# Patient Record
Sex: Female | Born: 1995 | Race: Black or African American | Hispanic: No | Marital: Single | Smoking: Never smoker
Health system: Southern US, Community
[De-identification: ages and names within clinical notes are randomized; demographics above are authoritative.]

## PROBLEM LIST (undated history)

## (undated) DIAGNOSIS — D649 Anemia, unspecified: Secondary | ICD-10-CM

## (undated) DIAGNOSIS — G629 Polyneuropathy, unspecified: Secondary | ICD-10-CM

## (undated) DIAGNOSIS — G319 Degenerative disease of nervous system, unspecified: Secondary | ICD-10-CM

## (undated) DIAGNOSIS — G610 Guillain-Barre syndrome: Secondary | ICD-10-CM

## (undated) HISTORY — PX: NO PAST SURGERIES: SHX2092

---
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
## 2014-04-11 ENCOUNTER — Emergency Department (HOSPITAL_COMMUNITY)
Admission: EM | Admit: 2014-04-11 | Discharge: 2014-04-11 | Disposition: A | Discharge status: Home / Self Care | Payer: Managed Care, Other (non HMO) | Attending: Emergency Medicine | Admitting: Emergency Medicine

## 2014-04-11 ENCOUNTER — Encounter (HOSPITAL_COMMUNITY): Payer: Self-pay | Admitting: Emergency Medicine

## 2014-04-11 DIAGNOSIS — Z862 Personal history of diseases of the blood and blood-forming organs and certain disorders involving the immune mechanism: Secondary | ICD-10-CM | POA: Diagnosis not present

## 2014-04-11 DIAGNOSIS — R209 Unspecified disturbances of skin sensation: Secondary | ICD-10-CM | POA: Insufficient documentation

## 2014-04-11 DIAGNOSIS — R269 Unspecified abnormalities of gait and mobility: Secondary | ICD-10-CM | POA: Insufficient documentation

## 2014-04-11 DIAGNOSIS — R5381 Other malaise: Secondary | ICD-10-CM | POA: Insufficient documentation

## 2014-04-11 DIAGNOSIS — G610 Guillain-Barre syndrome: Secondary | ICD-10-CM | POA: Insufficient documentation

## 2014-04-11 DIAGNOSIS — R5383 Other fatigue: Secondary | ICD-10-CM

## 2014-04-11 HISTORY — DX: Anemia, unspecified: D64.9

## 2014-04-11 NOTE — ED Notes (Addendum)
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 "Now legs just feel weak". (denies: pain fall or injury). "Tingling started with feet and went up to mid thigh, both sides equally". H/o distant GBS at age 89. PCP and neuro & ortho specialists in [place]. 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 PERRL. MAEx4 equally and strong. Reports "can walk, just difficult to walk". "felt like I would fall". Alert, NAD, calm, interactive, speech clear, no dyspnea, LS CTA. Here with other adults x2.

## 2014-04-11 NOTE — Discharge Instructions (Signed)
Guillain-Barre Syndrome °Guillain-Barré syndrome is a disorder in which the body's protection (immune) system attacks part of the nervous system. This syndrome is rare. Guillain-Barré is called a syndrome rather than a disease because it is not clear that a specific disease-causing agent is involved. °CAUSES  °· Usually Guillain-Barré occurs a few days or weeks after the patient has had symptoms of a viral infection: °¨ Breathing (respiratory). °¨ Stomach (gastrointestinal). °· Sometimes, surgery or vaccinations will set off the syndrome. °· The disorder can develop over the course of hours or days. Or it may take up to 3 to 4 weeks. °· No one yet knows why Guillain-Barré strikes some people and not others. Nor do they know what sets the disease in motion. °· What scientists do know is that the body's immune system begins to attack the body itself. This causes what is known as an autoimmune disease. °SYMPTOMS  °The first problems (symptoms) of this disorder include varying degrees of weakness or tingling sensations (feeling) in the legs. In many cases, the weakness and abnormal sensations spread to the arms and upper body. These symptoms may worsen until the muscles cannot be used at all. Then the patient is almost totally paralyzed. In these cases, the disorder is life-threatening. It is considered a medical emergency. The patient is often put on a respirator to assist with breathing. Most patients recover from even the most severe cases of this syndrome. But some continue to have some degree of weakness. °DIAGNOSIS  °Guillain-Barré is called a syndrome rather than a disease because it is not clear that a specific disease-causing agent is involved. Reflexes such as knee jerks are usually lost. Signals traveling along the nerve are slower. So a nerve conduction velocity (NCV) test can give caregivers clues to aid the diagnosis. This means they try to learn what is wrong. The cerebrospinal fluid (CSF) that bathes the  spinal cord and brain contains more protein than usual. So a caregiver may decide to perform a spinal tap. °TREATMENT  °There is no known cure for this syndrome. But treatments can give some relief and speed up the recovery in most patients. There are also a number of ways to treat the complications of the syndrome. Currently, plasmapheresis and high-dose immunoglobulin therapy are used. Plasmapheresis seems to reduce the severity and length of the Guillain-Barré episode. In high-dose immunoglobulin therapy, caregivers give intravenous injections of the proteins that, in small quantities, the immune system uses naturally to attack invading organisms. Researchers have found that giving high doses of these immunoglobulins to patients can lessen the immune system attack on the nervous system. The most important part of the treatment for this syndrome is keeping the patient's body functioning during recovery of the nervous system. This can sometimes require placing the patient on: °· A respirator. °· A heart monitor. °· Other machines that assist body function. °This syndrome can be devastating due to its sudden and unexpected beginning. Most people reach the stage of greatest weakness within the first 2 weeks after symptoms appear. By the third week of the illness, 90 percent of all patients are at their weakest. The recovery period may be as little as a few weeks. Or it can be as long as a few years. About 30 percent of patients still have a remaining weakness after 3 years. About 3 percent may suffer a return of muscle weakness and tingling sensations many years after the initial attack. °RESEARCH BEING DONE °Scientists are concentrating on finding new treatments and refining   existing ones. They are also looking at the workings of the immune system. They want to find which cells are responsible for beginning and carrying out the attack on the nervous system. The fact that so many cases of this syndrome begin after a  viral or germ (bacterial ) infection suggests that certain characteristics of some viruses and bacteria may activate the immune system improperly. Investigators are searching for those characteristics. Neurological scientists, immunologists, virologists, and pharmacologists are all working together: °· To learn how to prevent this disorder. °· To make better therapies available when it strikes. °FOR MORE INFORMATION °Guillain-Barre Syndrome Foundation International °www.gbs-cipd.org °Document Released: 08/19/2002 Document Revised: 11/21/2011 Document Reviewed: 10/30/2013 °ExitCare® Patient Information ©2015 [employer]. This information is not intended to replace advice given to you by your health care provider. Make sure you discuss any questions you have with your health care provider. ° °

## 2014-04-11 NOTE — ED Notes (Signed)
Pt's respirations are equal and non labored. 

## 2014-04-11 NOTE — ED Provider Notes (Signed)
CSN: [id number]     Arrival date & time 04/11/14  2040 History   First MD Initiated Contact with Patient 04/11/14 2056     Chief Complaint  Patient presents with  . Extremity Weakness    HPI Comments: Patient with a history of GBS presents with 2 episodes of tingling and gradual weakness in her legs bilaterally. First episode was at 11:45 AM when she was cooking. Came from out of no where and began in her feet. Couldn't stand and began to wobble. Lasted 45 minutes and then returned to baseline. Next episode was at 7 PM while she was at work at [employer], putting away dishes at [employer] place. Was same episode but was worse. Tingling goes to top of things and loss all feeling. Has since regained feeling but is still weak and can't fully walk. Has not had an episode like this. Last similar episode was 2.5 years ago when she was numb on her right arm. Came into the hospital and given some medicine. Take Neurontin when she has nerve pain, last took medication in January. Patient denies pain, fevers, being sick recently, rashes.  Patient is a 18 y.o. female presenting with extremity weakness. The history is provided by the patient. No language interpreter was used.  Extremity Weakness This is a new problem. The current episode started today. The problem has been resolved. Associated symptoms include numbness and weakness. Pertinent negatives include no abdominal pain, coughing, fatigue, fever, joint swelling, nausea, rash, sore throat or vomiting. The symptoms are aggravated by standing and walking. She has tried nothing for the symptoms.    Past Medical History  Diagnosis Date  . Guillain-Barre age 14  . Anemia    Past Surgical History  Procedure Laterality Date  . No past surgeries     No family history on file. History  Substance Use Topics  . Smoking status: Never Smoker   . Smokeless tobacco: Never Used  . Alcohol Use: Yes   OB History   Grav Para Term Preterm Abortions TAB SAB Ect Mult  Living                 Review of Systems  Constitutional: Negative for fever and fatigue.  HENT: Negative for sore throat.   Respiratory: Negative for cough.   Gastrointestinal: Negative for nausea, vomiting and abdominal pain.  Musculoskeletal: Positive for extremity weakness. Negative for joint swelling.  Skin: Negative for rash.  Neurological: Positive for weakness and numbness.  All other systems reviewed and are negative.   Allergies  Other - none  Home Medications   Prior to Admission medications   Medication Sig Start Date End Date Taking? Authorizing Provider  ibuprofen (ADVIL,MOTRIN) 200 MG tablet Take 200 mg by mouth every 6 (six) hours as needed for pain.    Historical Provider, MD  Iron - daily   Patient lives in [place]  BP 145/79  Pulse 84  Temp(Src) 98.3 F (36.8 C) (Oral)  Resp 16  Wt 105 lb 11.2 oz (47.945 kg)  SpO2 99% Physical Exam  Nursing note and vitals reviewed. Constitutional: She appears well-developed and well-nourished. No distress.  HENT:  Head: Normocephalic and atraumatic.  Nose: Nose normal.  Eyes: Conjunctivae and EOM are normal. Right eye exhibits no discharge. Left eye exhibits no discharge. No scleral icterus.  Neck: Normal range of motion. Neck supple.  Cardiovascular: Normal rate, regular rhythm and intact distal pulses.   No murmur heard. Pulmonary/Chest: Effort normal and breath sounds normal. No respiratory  distress.  No increase in WOB  Abdominal: Soft. Bowel sounds are normal. She exhibits no mass. There is no tenderness.  Musculoskeletal:  Strength 5/5 in lower extremities bilaterally. No weakness noted.  Neurological: She is alert. She has normal strength. She displays no atrophy, no tremor and normal reflexes. She exhibits normal muscle tone. She displays no seizure activity. Gait abnormal.  No patellar reflexes present bilaterally. Sensation is not intact on lateral aspects of dorsum of feet bilaterally. Intact on midline  and posterior region of feet. Sensation intact to deep palpation of toes bilaterally. Patient wobbles when she walks on both legs.  Skin: Skin is warm. No rash noted. No erythema.  Psychiatric: She has a normal mood and affect. Her behavior is normal.    ED Course  Procedures (including critical care time) Labs Review Labs Reviewed - No data to display  Imaging Review No results found.   EKG Interpretation None     Patient seen and examined. Spoke with colleague of Dr. Blain Paisartwright and stated that patient was last seen 1 year ago for FU. 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 Was supposed to have FU imaging in 1 year but did not. Stated that it was unusual to have a relapse of GBS this late. Would not do IVIG at this time with no respiratory abnormalities, overt weakness and studies. Would recommend patient to see how she does over the weekend. If she can't stand or walk or has respiratory impairment, should come [facility] to get admitted. If she improves over the weekend, should come to the clinic on Monday for an appointment. Could possibly have an EMG study at this time to see if any further progression of GBS.   MDM   Final diagnoses:  GBS (Guillain Barre syndrome)  See above for [facility] with [facility] Patient will monitor symptoms this weekend and decide a course of action In the meantime if have respiratory depression or can't walk or stand, will take immediate action     Preston FleetingAkilah O Donyale Berthold, MD 04/11/14 2250

## 2014-04-12 NOTE — ED Provider Notes (Signed)
I saw and evaluated the patient, reviewed the resident's note and I agree with the findings and plan. All other systems reviewed as per HPI, otherwise negative.   Pt with hx fo Karlene LinemanGuillian Barre as 18 year old with residual weakness, presents with acute onset of tingling and weakness x 2.  Returning to baseline at this time.  No loss of bowel or bladder.  On exam, pt at baseline.  D[facility] with [facility], and patient to follow up as outpatients since the symptoms have nearly resolved.  Family and patient aware of plan.  Chrystine Oileross J Messiyah Waterson, MD 04/12/14 [phone]

## 2016-09-04 ENCOUNTER — Emergency Department (HOSPITAL_COMMUNITY)
Admission: EM | Admit: 2016-09-04 | Discharge: 2016-09-04 | Disposition: A | Discharge status: Home / Self Care | Payer: Medicaid Other | Attending: Dermatology | Admitting: Dermatology

## 2016-09-04 ENCOUNTER — Encounter (HOSPITAL_COMMUNITY): Payer: Self-pay | Admitting: Oncology

## 2016-09-04 DIAGNOSIS — R200 Anesthesia of skin: Secondary | ICD-10-CM | POA: Insufficient documentation

## 2016-09-04 DIAGNOSIS — Z5321 Procedure and treatment not carried out due to patient leaving prior to being seen by health care provider: Secondary | ICD-10-CM | POA: Diagnosis not present

## 2016-09-04 NOTE — ED Notes (Signed)
Pt called w/o answer x 2.  Lobby is empty.  It is assumed the Pt left.
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
## 2016-09-04 NOTE — ED Notes (Signed)
Pt called w/o answer x 1.

## 2016-12-16 LAB — HM PAP SMEAR

## 2017-04-18 ENCOUNTER — Encounter (HOSPITAL_COMMUNITY): Payer: Self-pay | Admitting: *Deleted

## 2017-04-18 ENCOUNTER — Ambulatory Visit (HOSPITAL_COMMUNITY)
Admission: EM | Admit: 2017-04-18 | Discharge: 2017-04-18 | Disposition: A | Discharge status: Home / Self Care | Payer: 59 | Attending: Family Medicine | Admitting: Family Medicine

## 2017-04-18 DIAGNOSIS — R350 Frequency of micturition: Secondary | ICD-10-CM | POA: Diagnosis not present

## 2017-04-18 DIAGNOSIS — R300 Dysuria: Secondary | ICD-10-CM | POA: Diagnosis not present

## 2017-04-18 DIAGNOSIS — N3001 Acute cystitis with hematuria: Secondary | ICD-10-CM | POA: Insufficient documentation

## 2017-04-18 DIAGNOSIS — Z3202 Encounter for pregnancy test, result negative: Secondary | ICD-10-CM

## 2017-04-18 DIAGNOSIS — R0789 Other chest pain: Secondary | ICD-10-CM | POA: Insufficient documentation

## 2017-04-18 DIAGNOSIS — R079 Chest pain, unspecified: Secondary | ICD-10-CM | POA: Diagnosis present

## 2017-04-18 LAB — POCT URINALYSIS DIP (DEVICE)
Bilirubin Urine: NEGATIVE
Glucose, UA: NEGATIVE mg/dL
Ketones, ur: NEGATIVE mg/dL
NITRITE: NEGATIVE
PH: 6.5 (ref 5.0–8.0)
PROTEIN: NEGATIVE mg/dL
UROBILINOGEN UA: 0.2 mg/dL (ref 0.0–1.0)

## 2017-04-18 LAB — POCT PREGNANCY, URINE: Preg Test, Ur: NEGATIVE

## 2017-04-18 MED ORDER — SULFAMETHOXAZOLE-TRIMETHOPRIM 800-160 MG PO TABS: 1.0000 | ORAL_TABLET | Freq: Two times a day (BID) | ORAL | 0 refills | Status: AC

## 2017-04-18 NOTE — ED Provider Notes (Signed)
  [facility]    ASSESSMENT & PLAN:  Today you were diagnosed with the following: 1. Acute cystitis with hematuria   2. Chest pain, unspecified type    Meds ordered this encounter  Medications  . sulfamethoxazole-trimethoprim (BACTRIM DS,SEPTRA DS) 800-160 MG tablet    Sig: Take 1 tablet by mouth 2 (two) times daily.    Dispense:  10 tablet    Refill:  0   Urine culture sent. ECG: NSR; no worrisome abnormalities  Unsure of the exact cause of her chest symptoms but I reassured her that I do not feel this is anything dangerous. Will monitor. Copy of her ECG given.  If not improving over the next few days or if worsening she will follow up here or the Emergency Department if unable to see her regular doctor.  Outlined signs and symptoms indicating need for more acute intervention. Call or return to clinic if these symptoms worsen or fail to improve as anticipated. Patient verbalized understanding. After Visit Summary given.  SUBJECTIVE:  Taylor Pierce is a 21 y.o. female who complains of urinary frequency, urgency and dysuria for the past 2-3 days, without flank pain, fever, chills, abnormal vaginal discharge or bleeding. Does report hematuria today. Normal PO intake. No flank or abdominal pain. No self treatment. Ambulatory without problem.  Also for the past 2-3 days she has been feeling occasional anterior chest pains. Usually last several minutes then resolve. Describes as "tightness". No associated SOB or respiratory symptoms. Non-smoker. No specific aggravating or alleviating factors reported.  ROS: As per HPI. All other systems negative.  BP 120/74 (BP Location: Right Arm)   Pulse 86   Temp 98.2 F (36.8 C) (Oral)   Resp 16   OBJECTIVE:  Vitals:   04/18/17 1448  BP: 120/74  Pulse: 86  Resp: 16  Temp: 98.2 F (36.8 C)  TempSrc: Oral  SpO2: 99%   Appears well, in no apparent distress. Abdomen is soft without tenderness, guarding, mass, rebound or  organomegaly. No CVA tenderness or inguinal adenopathy noted. No chest wall discomfort. CV: sinus arrhythmia; regular rate. Lungs CTAB. Skin is warm and dry. She is alert and does not appear anxious.  Labs Reviewed  POCT URINALYSIS DIP (DEVICE) - Abnormal; Notable for the following:       Result Value   Hgb urine dipstick LARGE (*)    Leukocytes, UA MODERATE (*)    All other components within normal limits  URINE CULTURE  POCT PREGNANCY, URINE    Allergies  Allergen Reactions  . Other Other (See Comments)    IV pain medication, cannot remember name of drug        Mardella LaymanHagler, Stassi Fadely, MD 04/19/17 [phone]

## 2017-04-18 NOTE — ED Triage Notes (Signed)
Pt  Reports    Several  Days   Of  Low  abd  Pain   With   Hematuria  And  Notice   Some  Blood   When  She  Urinates      Pt  Ambulates   With a  Slow  Steady  Gait  And  Appears  In no  Acute   Severe   Distress

## 2017-04-20 LAB — URINE CULTURE

## 2017-05-27 ENCOUNTER — Encounter (HOSPITAL_COMMUNITY): Payer: Self-pay | Admitting: Emergency Medicine

## 2017-05-27 ENCOUNTER — Ambulatory Visit (HOSPITAL_COMMUNITY)
Admission: EM | Admit: 2017-05-27 | Discharge: 2017-05-27 | Disposition: A | Discharge status: Home / Self Care | Payer: 59 | Attending: Family Medicine | Admitting: Family Medicine

## 2017-05-27 DIAGNOSIS — G629 Polyneuropathy, unspecified: Secondary | ICD-10-CM

## 2017-05-27 MED ORDER — GABAPENTIN 300 MG PO CAPS: 300.0000 mg | ORAL_CAPSULE | Freq: Two times a day (BID) | ORAL | 0 refills | Status: DC

## 2017-05-27 NOTE — ED Provider Notes (Signed)
[facility]    CSN: [id number] Arrival date & time: 05/27/17  1310     History   Chief Complaint Chief Complaint  Patient presents with  . nerve pain    HPI Taylor Pierce is a 21 y.o. female.   Pt here for nerve pain in the right side of her head, right leg and associated numbness.  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  She originally developed the neurological syndrome at age 89.  She has flare-ups of pain periodically  She states she had taken Gabapentin but has not been taking it because she was pregnant and breast feeding.  She has an appointment with her neurologist in [place], Dr. Blain Pais  She works at [employer].  She has 83 month old daughter with her today.      Past Medical History:  Diagnosis Date  . Anemia   . Guillain-Barre [facility]) age 89    There are no active problems to display for this patient.   Past Surgical History:  Procedure Laterality Date  . NO PAST SURGERIES      OB History    Gravida Para Term Preterm AB Living   1             SAB TAB Ectopic Multiple Live Births                   Home Medications    Prior to Admission medications   Medication Sig Start Date End Date Taking? Authorizing Provider  gabapentin (NEURONTIN) 300 MG capsule Take 1 capsule (300 mg total) by mouth 2 (two) times daily. 05/27/17   Elvina Sidle, MD  ibuprofen (ADVIL,MOTRIN) 200 MG tablet Take 200 mg by mouth every 6 (six) hours as needed for pain.    [provider]    Family History History reviewed. No pertinent family history.  Social History Social History  Substance Use Topics  . Smoking status: Never Smoker  . Smokeless tobacco: Never Used  . Alcohol use Yes     Allergies   Other   Review of Systems Review of Systems  Constitutional: Negative.   HENT: Negative.   Eyes: Negative.   Respiratory: Negative.   Cardiovascular: Negative.   Gastrointestinal: Negative.       Physical Exam Triage Vital Signs ED Triage Vitals  Enc Vitals Group     BP 05/27/17 1412 119/73     Pulse Rate 05/27/17 1412 79     Resp --      Temp 05/27/17 1412 98.4 F (36.9 C)     Temp Source 05/27/17 1412 Oral     SpO2 05/27/17 1412 99 %     Weight --      Height --      Head Circumference --      Peak Flow --      Pain Score 05/27/17 1415 7     Pain Loc --      Pain Edu? --      Excl. in GC? --    No data found.   Updated Vital Signs BP 119/73 (BP Location: Left Arm)   Pulse 79   Temp 98.4 F (36.9 C) (Oral)   SpO2 99%   Breastfeeding? No    Physical Exam  Constitutional: She is oriented to person, place, and time.  HENT:  Right Ear: External ear normal.  Left Ear: External ear normal.  Mouth/Throat: Oropharynx is clear and moist.  Eyes: Pupils are equal, round, and reactive to light. Conjunctivae are normal.  Neck: Normal range of motion. Neck supple.  Pulmonary/Chest: Effort normal.  Musculoskeletal: Normal range of motion.  Neurological: She is alert and oriented to person, place, and time. No cranial nerve deficit. Coordination normal.  Skin: Skin is warm and dry.  Nursing note and vitals reviewed.    UC Treatments / Results  Labs (all labs ordered are listed, but only abnormal results are displayed) Labs Reviewed - No data to display  EKG  EKG Interpretation None       Radiology No results found.  Procedures Procedures (including critical care time)  Medications Ordered in UC Medications - No data to display   Initial Impression / Assessment and Plan / UC Course  I have reviewed the triage vital signs and the nursing notes.  Pertinent labs & imaging results that were available during my care of the patient were reviewed by me and considered in my medical decision making (see chart for details).     Final Clinical Impressions(s) / UC Diagnoses   Final diagnoses:  Neuropathy    New Prescriptions New Prescriptions    GABAPENTIN (NEURONTIN) 300 MG CAPSULE    Take 1 capsule (300 mg total) by mouth 2 (two) times daily.     Controlled Substance Prescriptions [place] Controlled Substance Registry consulted? Not Applicable   Elvina Sidle, MD 05/27/17 1455

## 2017-05-27 NOTE — Discharge Instructions (Signed)
Follow up with Dr. Blain Pais as planned  If not getting pain relief in one week, increase gabapentin to three times a day.

## 2017-05-27 NOTE — ED Triage Notes (Signed)
Pt here for nerve pain in the right side of her head, right leg and associated numbness.  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  She states she takes Gabapentin but has not been taking it because she was pregnant and breast feeding.

## 2018-01-14 ENCOUNTER — Emergency Department (HOSPITAL_COMMUNITY)
Admission: EM | Admit: 2018-01-14 | Discharge: 2018-01-14 | Disposition: A | Discharge status: Home / Self Care | Payer: 59 | Attending: Emergency Medicine | Admitting: Emergency Medicine

## 2018-01-14 ENCOUNTER — Emergency Department (HOSPITAL_COMMUNITY): Payer: 59

## 2018-01-14 ENCOUNTER — Other Ambulatory Visit: Payer: Self-pay | Admitting: Emergency Medicine

## 2018-01-14 ENCOUNTER — Other Ambulatory Visit: Payer: Self-pay

## 2018-01-14 ENCOUNTER — Encounter (HOSPITAL_COMMUNITY): Payer: Self-pay | Admitting: Emergency Medicine

## 2018-01-14 DIAGNOSIS — N939 Abnormal uterine and vaginal bleeding, unspecified: Secondary | ICD-10-CM | POA: Diagnosis not present

## 2018-01-14 LAB — CBC
HCT: 35.4 % — ABNORMAL LOW (ref 36.0–46.0)
HEMOGLOBIN: 11.2 g/dL — AB (ref 12.0–15.0)
MCH: 26.5 pg (ref 26.0–34.0)
MCHC: 31.6 g/dL (ref 30.0–36.0)
MCV: 83.7 fL (ref 78.0–100.0)
Platelets: 356 10*3/uL (ref 150–400)
RBC: 4.23 MIL/uL (ref 3.87–5.11)
RDW: 15.4 % (ref 11.5–15.5)
WBC: 5.8 10*3/uL (ref 4.0–10.5)

## 2018-01-14 LAB — WET PREP, GENITAL
SPERM: NONE SEEN
Trich, Wet Prep: NONE SEEN
WBC WET PREP: NONE SEEN
Yeast Wet Prep HPF POC: NONE SEEN

## 2018-01-14 LAB — BASIC METABOLIC PANEL
Anion gap: 8 (ref 5–15)
BUN: 11 mg/dL (ref 6–20)
CHLORIDE: 102 mmol/L (ref 101–111)
CO2: 26 mmol/L (ref 22–32)
CREATININE: 0.63 mg/dL (ref 0.44–1.00)
Calcium: 9.4 mg/dL (ref 8.9–10.3)
GFR calc non Af Amer: >60 mL/min (ref >60)
Glucose, Bld: 91 mg/dL (ref 65–99)
POTASSIUM: 4.2 mmol/L (ref 3.5–5.1)
SODIUM: 136 mmol/L (ref 135–145)

## 2018-01-14 LAB — I-STAT BETA HCG BLOOD, ED (MC, WL, AP ONLY): I-stat hCG, quantitative: <5 m[IU]/mL (ref <5)

## 2018-01-14 NOTE — ED Notes (Signed)
Results reviewed, no changes to acuity at this time 

## 2018-01-14 NOTE — ED Triage Notes (Signed)
Pt. Stated, Taylor Pierce had vaginal bleeding for 2 weeks and I had an abortion in March, and I called my Gynecologist and he said to come here since I had an abortion earlier.

## 2018-01-14 NOTE — ED Provider Notes (Signed)
[facility] EMERGENCY DEPARTMENT Provider Note   CSN: [id number] Arrival date & time: 01/14/18  1230     History   Chief Complaint Chief Complaint  Patient presents with  . Vaginal Bleeding    HPI Taylor Pierce is a 22 y.o. female who presents emergency department for chief complaint of vaginal bleeding.  She has a past medical history of baseline anemia  And Guillan Barre. She is status post spontaneous vaginal delivery 14 months ago.  Patient had a D&C 2 months ago and is not currently on any birth control.  She states that after her D&C she had intermittent bleeding however 2 weeks ago she began having heavy vaginal bleeding and has been soaking through about 7 overnight pads daily.  She has been feeling lightheaded, fatigued and weak but denies shortness of breath.  The denies abdominal pain or cramping.  She has not had a regular period since giving birth.  She is not currently breast-feeding.  She spoke with her GYN this morning who recommended she come to the ER for evaluation and ultrasound.  Unable to see their phone conversation in the EMR.  HPI  Past Medical History:  Diagnosis Date  . Anemia   . Guillain-Barre [facility]) age 73    There are no active problems to display for this patient.   Past Surgical History:  Procedure Laterality Date  . NO PAST SURGERIES       OB History    Gravida  1   Para      Term      Preterm      AB      Living        SAB      TAB      Ectopic      Multiple      Live Births               Home Medications    Prior to Admission medications   Medication Sig Start Date End Date Taking? Authorizing Provider  gabapentin (NEURONTIN) 300 MG capsule Take 1 capsule (300 mg total) by mouth 2 (two) times daily. 05/27/17   Elvina Sidle, MD  ibuprofen (ADVIL,MOTRIN) 200 MG tablet Take 200 mg by mouth every 6 (six) hours as needed for pain.    [provider]    Family History No family history  on file.  Social History Social History   Tobacco Use  . Smoking status: Never Smoker  . Smokeless tobacco: Never Used  Substance Use Topics  . Alcohol use: Yes  . Drug use: No     Allergies   Other   Review of Systems Review of Systems  Ten systems reviewed and are negative for acute change, except as noted in the HPI.   Physical Exam Updated Vital Signs BP 113/75 (BP Location: Right Arm)   Pulse 90   Temp 98.7 F (37.1 C) (Oral)   Resp 18   Ht  (1.6 m)   Wt 49 kg (108 lb)   LMP 12/31/2017   SpO2 100%   BMI 19.13 kg/m   Physical Exam  Constitutional: She is oriented to person, place, and time. She appears well-developed and well-nourished. No distress.  HENT:  Head: Normocephalic and atraumatic.  Eyes: Conjunctivae are normal. No scleral icterus.  Neck: Normal range of motion.  Cardiovascular: Normal rate, regular rhythm and normal heart sounds. Exam reveals no gallop and no friction rub.  No murmur heard. Pulmonary/Chest: Effort  normal and breath sounds normal. No respiratory distress.  Abdominal: Soft. Bowel sounds are normal. She exhibits no distension and no mass. There is no tenderness. There is no guarding.  Genitourinary:  Genitourinary Comments: Pelvic exam: VULVA: normal appearing vulva with no masses, tenderness or lesions, VAGINA: normal appearing vagina with normal color and discharge, no lesions, CERVIX: cervical motion tenderness absent, multiparous os, Bleeding from the Cervical OS, ADNEXA: normal adnexa in size, nontender and no masses, exam chaperoned by NT Ezzard Flax.   Neurological: She is alert and oriented to person, place, and time.  Skin: Skin is warm and dry. She is not diaphoretic.  Psychiatric: Her behavior is normal.  Nursing note and vitals reviewed.    ED Treatments / Results  Labs (all labs ordered are listed, but only abnormal results are displayed) Labs Reviewed  WET PREP, GENITAL  RPR  HIV ANTIBODY (ROUTINE TESTING)   URINALYSIS, ROUTINE W REFLEX MICROSCOPIC  CBC  BASIC METABOLIC PANEL  I-STAT BETA HCG BLOOD, ED ([facility], [facility], [facility] ONLY)  GC/CHLAMYDIA PROBE AMP ([place]) NOT AT [facility]    EKG None  Radiology No results found.  Procedures Procedures (including critical care time)  Medications Ordered in ED Medications - No data to display   Initial Impression / Assessment and Plan / ED Course  I have reviewed the triage vital signs and the nursing notes.  Pertinent labs & imaging results that were available during my care of the patient were reviewed by me and considered in my medical decision making (see chart for details).     Patient with vaginal bleeding.  No acute abnormality seen on ultrasound.  Her hemoglobin is at baseline.  Mild clue cells however she is not complaining of any vaginal odor is not doing treatment at this time.  Patient has available follow-up with OB/GYN I have suggested that she see her OB/GYN this week.  She is hemodynamically stable and appears appropriate for discharge at this time  Final Clinical Impressions(s) / ED Diagnoses   Final diagnoses:  Abnormal uterine bleeding    ED Discharge Orders    None       Arthor Captain, PA-C 01/14/18 2355    Raeford Razor, MD 01/16/18 1131

## 2018-01-14 NOTE — ED Notes (Signed)
Writer call for reassessing vitals, no response 

## 2018-01-14 NOTE — ED Notes (Signed)
Patient transported to Ultrasound 

## 2018-01-14 NOTE — Discharge Instructions (Addendum)
Your labs and imaging show no emergent abnormalities. You will need to see your ob/gyn for follow up this week.    Return to the Emergency department: You pass out. You are changing pads every 15 to 30 minutes. You have abdominal pain. You have a fever. You become sweaty or weak. You are passing large blood clots from the vagina. You start to feel nauseous and vomit.

## 2018-01-15 LAB — GC/CHLAMYDIA PROBE AMP (~~LOC~~) NOT AT ARMC
Chlamydia: POSITIVE — AB
NEISSERIA GONORRHEA: NEGATIVE

## 2018-01-15 LAB — HIV ANTIBODY (ROUTINE TESTING W REFLEX): HIV Screen 4th Generation wRfx: NONREACTIVE

## 2018-03-21 ENCOUNTER — Emergency Department: Payer: 59

## 2018-03-21 ENCOUNTER — Emergency Department
Admission: EM | Admit: 2018-03-21 | Discharge: 2018-03-21 | Disposition: A | Discharge status: Home / Self Care | Payer: 59 | Attending: Emergency Medicine | Admitting: Emergency Medicine

## 2018-03-21 ENCOUNTER — Encounter: Payer: Self-pay | Admitting: Emergency Medicine

## 2018-03-21 DIAGNOSIS — Y929 Unspecified place or not applicable: Secondary | ICD-10-CM | POA: Diagnosis not present

## 2018-03-21 DIAGNOSIS — S93401A Sprain of unspecified ligament of right ankle, initial encounter: Secondary | ICD-10-CM | POA: Diagnosis not present

## 2018-03-21 DIAGNOSIS — Z79899 Other long term (current) drug therapy: Secondary | ICD-10-CM | POA: Diagnosis not present

## 2018-03-21 DIAGNOSIS — Y939 Activity, unspecified: Secondary | ICD-10-CM | POA: Insufficient documentation

## 2018-03-21 DIAGNOSIS — Y999 Unspecified external cause status: Secondary | ICD-10-CM | POA: Insufficient documentation

## 2018-03-21 DIAGNOSIS — X501XXA Overexertion from prolonged static or awkward postures, initial encounter: Secondary | ICD-10-CM | POA: Diagnosis not present

## 2018-03-21 DIAGNOSIS — S99911A Unspecified injury of right ankle, initial encounter: Secondary | ICD-10-CM | POA: Diagnosis present

## 2018-03-21 MED ORDER — IBUPROFEN 600 MG PO TABS: 600.0000 mg | ORAL_TABLET | Freq: Three times a day (TID) | ORAL | 0 refills | Status: DC | PRN

## 2018-03-21 MED ORDER — IBUPROFEN 800 MG PO TABS
ORAL_TABLET | ORAL | Status: AC
  Filled 2018-03-21: qty 1

## 2018-03-21 MED ORDER — IBUPROFEN 800 MG PO TABS
800.0000 mg | ORAL_TABLET | Freq: Once | ORAL | Status: AC
  Administered 2018-03-21: 800 mg via ORAL

## 2018-03-21 NOTE — ED Triage Notes (Signed)
Patient presents to the ED with right ankle pain after she tripped and fell down some stairs.  Patient denies hitting her head or losing consciousness.  Patient reports using ice and ibuprofen with no improvement.

## 2018-03-21 NOTE — Discharge Instructions (Signed)
It was a pleasure to take care of you today, and thank you for coming to our emergency department.  If you have any questions or concerns before leaving please ask the nurse to grab me and I'm more than happy to go through your aftercare instructions again.  If you were prescribed any opioid pain medication today such as Norco, Vicodin, Percocet, morphine, hydrocodone, or oxycodone please make sure you do not drive when you are taking this medication as it can alter your ability to drive safely.  If you have any concerns once you are home that you are not improving or are in fact getting worse before you can make it to your follow-up appointment, please do not hesitate to call 911 and come back for further evaluation.  Merrily BrittleNeil Joe Tanney, MD  Results for orders placed or performed in visit on 01/14/18  GC/Chlamydia probe amp ([place])not at [facility]  Result Value Ref Range   Chlamydia **POSITIVE** (A)    Neisseria gonorrhea Negative    Dg Ankle Complete Right  Result Date: 03/21/2018 CLINICAL DATA:  Right ankle pain after fall down stairs. EXAM: RIGHT ANKLE - COMPLETE 3+ VIEW COMPARISON:  None. FINDINGS: There is no evidence of fracture, dislocation, or joint effusion. There is no evidence of arthropathy or other focal bone abnormality. Soft tissues are unremarkable. IMPRESSION: Normal right ankle. Electronically Signed   By: Lupita RaiderJames  Green Jr, M.D.   On: 03/21/2018 19:22

## 2018-03-21 NOTE — ED Provider Notes (Signed)
[facility] Emergency Department Provider Note  ____________________________________________   First MD Initiated Contact with Patient 03/21/18 1928     (approximate)  I have reviewed the triage vital signs and the nursing notes.   HISTORY  Chief Complaint Ankle Pain   HPI Taylor Pierce is a 22 y.o. female who self presents to the emergency department with sudden onset moderate severity medial left ankle pain that began suddenly when she missed a step and rolled her ankle earlier today.  Her pain is worse when bearing weight and improved when elevating her ankle.  No numbness or weakness.  She did not hit her head.    Past Medical History:  Diagnosis Date  . Anemia   . Guillain-Barre [facility]) age 17    There are no active problems to display for this patient.   Past Surgical History:  Procedure Laterality Date  . NO PAST SURGERIES      Prior to Admission medications   Medication Sig Start Date End Date Taking? Authorizing Provider  gabapentin (NEURONTIN) 300 MG capsule Take 1 capsule (300 mg total) by mouth 2 (two) times daily. 05/27/17   Elvina SidleLauenstein, Kurt, MD  ibuprofen (ADVIL,MOTRIN) 600 MG tablet Take 1 tablet (600 mg total) by mouth every 8 (eight) hours as needed. 03/21/18   Merrily Brittleifenbark, Teretha Chalupa, MD    Allergies Other  No family history on file.  Social History Social History   Tobacco Use  . Smoking status: Never Smoker  . Smokeless tobacco: Never Used  Substance Use Topics  . Alcohol use: Yes  . Drug use: No    Review of Systems Constitutional: No fever/chills. Cardiovascular: Denies chest pain. Respiratory: Denies shortness of breath. Gastrointestinal: No abdominal pain.  No nausea, no vomiting.  Musculoskeletal: Positive for ankle pain Neurological: Negative for headaches   ____________________________________________   PHYSICAL EXAM:  VITAL SIGNS: ED Triage Vitals  Enc Vitals Group     BP 03/21/18 1836 116/70     Pulse  Rate 03/21/18 1836 87     Resp 03/21/18 1836 16     Temp 03/21/18 1836 99.1 F (37.3 C)     Temp Source 03/21/18 1836 Oral     SpO2 03/21/18 1836 100 %     Weight 03/21/18 1837 108 lb (49 kg)     Height 03/21/18 1837 5\' 3"  (1.6 m)     Head Circumference --      Peak Flow --      Pain Score 03/21/18 1836 5     Pain Loc --      Pain Edu? --      Excl. in GC? --     Constitutional: Alert and oriented x4 joking laughing pleasant cooperative  Head: Atraumatic. Nose: No congestion/rhinnorhea. Mouth/Throat: No trismus Neck: No stridor.   Cardiovascular: Regular rate and rhythm Respiratory: Normal respiratory effort.  No retractions. MSK: No tenderness over medial malleolus or lateral malleolus or for 6 cm proximal No tenderness over navicular, midfoot, or fifth metatarsal 2+ dorsalis pedis pulse Skin closed Compartments soft Patient can fire extensor hallucis longus, extensor digitorum longus, flexor hallucis longus, flexor digitorum longus, tibialis anterior, and gastrocnemius Sensation intact to light touch to sural, saphenous, deep peroneal, superficial peroneal, and tibial nerve Somewhat tender over medial ankle anterior to the medial mall Neurologic:  Normal speech and language. No gross focal neurologic deficits are appreciated.  Skin:  Skin is warm, dry and intact. No rash noted.    ____________________________________________  LABS (all labs  ordered are listed, but only abnormal results are displayed)  Labs Reviewed - No data to display   __________________________________________  EKG   ____________________________________________  RADIOLOGY  Ankle x-ray reviewed by me with no acute disease ____________________________________________   DIFFERENTIAL includes but not limited to  Ankle fracture, ankle sprain, ankle dislocation   PROCEDURES  Procedure(s) performed: no  Procedures  Critical Care performed:  no  ____________________________________________   INITIAL IMPRESSION / ASSESSMENT AND PLAN / ED COURSE  Pertinent labs & imaging results that were available during my care of the patient were reviewed by me and considered in my medical decision making (see chart for details).   The patient is neuro intact with negative x-rays.  Interestingly her pain is over the medial side when I would most expected to be lateral.  Regardless she is placed in an Ace wrap with improvement in her symptoms.  I will give her 2 days off of work and refer her back to primary care.      ____________________________________________   FINAL CLINICAL IMPRESSION(S) / ED DIAGNOSES  Final diagnoses:  Sprain of right ankle, unspecified ligament, initial encounter      NEW MEDICATIONS STARTED DURING THIS VISIT:  Discharge Medication List as of 03/21/2018  7:47 PM       Note:  This document was prepared using Dragon voice recognition software and may include unintentional dictation errors.      Merrily Brittle, MD 03/23/18 1429

## 2018-05-23 ENCOUNTER — Observation Stay
Admission: EM | Admit: 2018-05-23 | Discharge: 2018-05-24 | Disposition: A | Discharge status: Home / Self Care | Payer: No Typology Code available for payment source | Attending: Obstetrics and Gynecology | Admitting: Obstetrics and Gynecology

## 2018-05-23 ENCOUNTER — Other Ambulatory Visit: Payer: Self-pay

## 2018-05-23 DIAGNOSIS — O031 Delayed or excessive hemorrhage following incomplete spontaneous abortion: Principal | ICD-10-CM | POA: Insufficient documentation

## 2018-05-23 DIAGNOSIS — Z79899 Other long term (current) drug therapy: Secondary | ICD-10-CM | POA: Diagnosis not present

## 2018-05-23 DIAGNOSIS — Z886 Allergy status to analgesic agent status: Secondary | ICD-10-CM | POA: Insufficient documentation

## 2018-05-23 DIAGNOSIS — Z881 Allergy status to other antibiotic agents status: Secondary | ICD-10-CM | POA: Diagnosis not present

## 2018-05-23 DIAGNOSIS — R102 Pelvic and perineal pain: Secondary | ICD-10-CM | POA: Diagnosis present

## 2018-05-23 LAB — BASIC METABOLIC PANEL
Anion gap: 6 (ref 5–15)
BUN: 6 mg/dL (ref 6–20)
CO2: 25 mmol/L (ref 22–32)
CREATININE: 0.58 mg/dL (ref 0.44–1.00)
Calcium: 9.3 mg/dL (ref 8.9–10.3)
Chloride: 106 mmol/L (ref 98–111)
GFR calc Af Amer: >60 mL/min (ref >60)
GFR calc non Af Amer: >60 mL/min (ref >60)
Glucose, Bld: 109 mg/dL — ABNORMAL HIGH (ref 70–99)
Potassium: 3.9 mmol/L (ref 3.5–5.1)
SODIUM: 137 mmol/L (ref 135–145)

## 2018-05-23 LAB — CBC
HCT: 31.8 % — ABNORMAL LOW (ref 35.0–47.0)
Hemoglobin: 10.3 g/dL — ABNORMAL LOW (ref 12.0–16.0)
MCH: 27 pg (ref 26.0–34.0)
MCHC: 32.4 g/dL (ref 32.0–36.0)
MCV: 83.6 fL (ref 80.0–100.0)
PLATELETS: 258 10*3/uL (ref 150–440)
RBC: 3.8 MIL/uL (ref 3.80–5.20)
RDW: 16.9 % — AB (ref 11.5–14.5)
WBC: 6.8 10*3/uL (ref 3.6–11.0)

## 2018-05-23 LAB — ABO/RH: ABO/RH(D): B POS

## 2018-05-23 LAB — HCG, QUANTITATIVE, PREGNANCY: hCG, Beta Chain, Quant, S: 336 m[IU]/mL — ABNORMAL HIGH (ref <5)

## 2018-05-23 MED ORDER — TRANEXAMIC ACID 1000 MG/10ML IV SOLN
1000.0000 mg | Freq: Once | INTRAVENOUS | Status: AC
  Administered 2018-05-23: 1000 mg via INTRAVENOUS
  Filled 2018-05-23: qty 10

## 2018-05-23 NOTE — ED Triage Notes (Signed)
Pt arrives to ED via POV from home with c/o vaginal bleeding x1 day that started around 130pm. Pt reports she has used approximately 2 pads used per hour since onset. Pt states she had a D&E on Sept 5th. Pt denies SHOB or CP, no N/V/D or fever.

## 2018-05-23 NOTE — ED Provider Notes (Signed)
[facility] Emergency Department Provider Note  ____________________________________________   First MD Initiated Contact with Patient 05/23/18 2325     (approximate)  I have reviewed the triage vital signs and the nursing notes.   HISTORY  Chief Complaint Vaginal Bleeding   HPI Taylor Pierce is a 22 y.o. female who self presents to the emergency department with bright red blood from her vagina since 3 PM today.  6 days ago she had a D&E performed at [facility] in [place] when she was [redacted] weeks pregnant with an undesired pregnancy.  She is G3 P1 with a 12-week abortion earlier this year.  She had minimal bleeding following the procedure however began to have bright red blood from her vagina at 3 PM.  She has soaked through 4 pads and multiple pairs of underwear and has had to sit down on a towel.  She denies chest pain or shortness of breath.  She does report some lower abdominal discomfort but only minimally.    Past Medical History:  Diagnosis Date  . Anemia   . 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 Retained products of conception with hemorrhage 05/24/2018    Past Surgical History:  Procedure Laterality Date  . NO PAST SURGERIES      Prior to Admission medications   Medication Sig Start Date End Date Taking? Authorizing Provider  ibuprofen (ADVIL,MOTRIN) 600 MG tablet Take 1 tablet (600 mg total) by mouth every 6 (six) hours as needed (mild pain). 05/24/18   Conard Novak, MD  methylergonovine (METHERGINE) 0.2 MG tablet Take 1 tablet (0.2 mg total) by mouth 3 (three) times daily. 05/24/18   Conard Novak, MD    Allergies Macrobid [nitrofurantoin macrocrystal] and Other  History reviewed. No pertinent family history.  Social History Social History   Tobacco Use  . Smoking status: Never Smoker  . Smokeless tobacco: Never Used  Substance Use Topics  . Alcohol use: Yes  .  Drug use: No    Review of Systems Constitutional: No fever/chills Eyes: No visual changes. ENT: No sore throat. Cardiovascular: Denies chest pain. Respiratory: Denies shortness of breath. Gastrointestinal: Positive for abdominal pain.  No nausea, no vomiting.  No diarrhea.  No constipation. Genitourinary: Positive for vaginal bleeding Musculoskeletal: Negative for back pain. Skin: Negative for rash. Neurological: Negative for headaches, focal weakness or numbness.   ____________________________________________   PHYSICAL EXAM:  VITAL SIGNS: ED Triage Vitals  Enc Vitals Group     BP 05/23/18 2155 130/87     Pulse Rate 05/23/18 2155 78     Resp 05/23/18 2155 17     Temp 05/23/18 2155 98.7 F (37.1 C)     Temp Source 05/23/18 2155 Oral     SpO2 05/23/18 2155 100 %     Weight 05/23/18 2153 104 lb (47.2 kg)     Height 05/23/18 2153 5\' 3"  (1.6 m)     Head Circumference --      Peak Flow --      Pain Score 05/23/18 2152 0     Pain Loc --      Pain Edu? --      Excl. in GC? --     Constitutional: Alert and oriented x4 pleasant cooperative speaks in full clear sentences no diaphoresis Eyes: PERRL EOMI. Head: Atraumatic. Nose: No congestion/rhinnorhea. Mouth/Throat: No trismus Neck: No stridor.   Cardiovascular: Normal rate, regular rhythm. Grossly  normal heart sounds.  Good peripheral circulation. Respiratory: Normal respiratory effort.  No retractions. Lungs CTAB and moving good air Gastrointestinal: Soft mild suprapubic tenderness no peritonitis Pelvic exam performed with female nurse chaperone D: Moderate amount of clotted blood in the vault.  Also appears open with small amount of bleeding but no bright red blood.  No vaginal lacerations noted Musculoskeletal: No lower extremity edema   Neurologic:  Normal speech and language. No gross focal neurologic deficits are appreciated. Skin:  Skin is warm, dry and intact. No rash noted. Psychiatric: Mood and affect are  normal. Speech and behavior are normal.    ____________________________________________   DIFFERENTIAL includes but not limited to  Retained products of conception, uterine perforation, vaginal laceration, cervical laceration ____________________________________________   LABS (all labs ordered are listed, but only abnormal results are displayed)  Labs Reviewed  BASIC METABOLIC PANEL - Abnormal; Notable for the following components:      Result Value   Glucose, Bld 109 (*)    All other components within normal limits  CBC - Abnormal; Notable for the following components:   Hemoglobin 10.3 (*)    HCT 31.8 (*)    RDW 16.9 (*)    All other components within normal limits  HCG, QUANTITATIVE, PREGNANCY - Abnormal; Notable for the following components:   hCG, Beta Chain, Quant, S 336 (*)    All other components within normal limits  CBC - Abnormal; Notable for the following components:   RBC 2.70 (*)    Hemoglobin 7.4 (*)    HCT 22.5 (*)    RDW 16.9 (*)    All other components within normal limits  CBC - Abnormal; Notable for the following components:   RBC 3.41 (*)    Hemoglobin 9.3 (*)    HCT 28.8 (*)    RDW 17.2 (*)    All other components within normal limits  BASIC METABOLIC PANEL - Abnormal; Notable for the following components:   Glucose, Bld 132 (*)    BUN <5 (*)    Calcium 8.8 (*)    All other components within normal limits  PROTIME-INR  ABO/RH  SURGICAL PATHOLOGY    Lab work reviewed by me with down trending hCG and hemoglobin that is only slightly lower than baseline __________________________________________  EKG   ____________________________________________  RADIOLOGY  Helical ultrasound reviewed by me concerning for retained products of conception ____________________________________________   PROCEDURES  Procedure(s) performed: no  Procedures  Critical Care performed: no  ____________________________________________   INITIAL  IMPRESSION / ASSESSMENT AND PLAN / ED COURSE  Pertinent labs & imaging results that were available during my care of the patient were reviewed by me and considered in my medical decision making (see chart for details).   As part of my medical decision making, I reviewed the following data within the electronic MEDICAL RECORD NUMBER History obtained from family if available, nursing notes, old chart and ekg, as well as notes from prior ED visits.  The patient comes to the emergency department with worsening vaginal bleeding and pain about 6 days after having a D&E.  I spoke with on-call OB gynecologist Dr. Jean Rosenthal who recommends pelvic ultrasound and if it is unremarkable to discharge patient with Methergine.  I will also give her a dose of tranexamic acid now.  She declines pain medication at this point.     The patient's ultrasound is concerning for retained products of conception.  I discussed with Dr. Jean Rosenthal once again who will kindly come  evaluate the patient and likely take her to the operating room for [facility].  __________________________   FINAL CLINICAL IMPRESSION(S) / ED DIAGNOSES  Final diagnoses:  Pelvic pain  Retained products of conception with hemorrhage      NEW MEDICATIONS STARTED DURING THIS VISIT:  Discharge Medication List as of 05/24/2018  3:45 PM    START taking these medications   Details  methylergonovine (METHERGINE) 0.2 MG tablet Take 1 tablet (0.2 mg total) by mouth 3 (three) times daily., Starting Thu 05/24/2018, Print         Note:  This document was prepared using Dragon voice recognition software and may include unintentional dictation errors.     Merrily Brittle, MD 05/25/18 [id number]

## 2018-05-23 NOTE — ED Notes (Signed)
Spoke with Dr Roxan Hockey regarding pt's presenting c/o; verbal orders obtained for lab only at this time; will await HCG results prior to ordering u/s

## 2018-05-24 ENCOUNTER — Emergency Department: Payer: No Typology Code available for payment source

## 2018-05-24 ENCOUNTER — Encounter
Admission: EM | Disposition: A | Discharge status: Home / Self Care | Payer: Self-pay | Source: Home / Self Care | Attending: Emergency Medicine

## 2018-05-24 ENCOUNTER — Other Ambulatory Visit: Payer: Self-pay

## 2018-05-24 DIAGNOSIS — O071 Delayed or excessive hemorrhage following failed attempted termination of pregnancy: Secondary | ICD-10-CM | POA: Diagnosis not present

## 2018-05-24 HISTORY — PX: DILATION AND EVACUATION: SHX1459

## 2018-05-24 LAB — BASIC METABOLIC PANEL
Anion gap: 8 (ref 5–15)
BUN: <5 mg/dL — ABNORMAL LOW (ref 6–20)
CALCIUM: 8.8 mg/dL — AB (ref 8.9–10.3)
CO2: 24 mmol/L (ref 22–32)
CREATININE: 0.65 mg/dL (ref 0.44–1.00)
Chloride: 110 mmol/L (ref 98–111)
Glucose, Bld: 132 mg/dL — ABNORMAL HIGH (ref 70–99)
Potassium: 3.8 mmol/L (ref 3.5–5.1)
SODIUM: 142 mmol/L (ref 135–145)

## 2018-05-24 LAB — CBC
HCT: 28.8 % — ABNORMAL LOW (ref 35.0–47.0)
HEMATOCRIT: 22.5 % — AB (ref 35.0–47.0)
HEMOGLOBIN: 7.4 g/dL — AB (ref 12.0–16.0)
Hemoglobin: 9.3 g/dL — ABNORMAL LOW (ref 12.0–16.0)
MCH: 27.6 pg (ref 26.0–34.0)
MCH: 27.4 pg (ref 26.0–34.0)
MCHC: 33.1 g/dL (ref 32.0–36.0)
MCHC: 32.4 g/dL (ref 32.0–36.0)
MCV: 83.4 fL (ref 80.0–100.0)
MCV: 84.6 fL (ref 80.0–100.0)
PLATELETS: 273 10*3/uL (ref 150–440)
Platelets: 163 10*3/uL (ref 150–440)
RBC: 2.7 MIL/uL — AB (ref 3.80–5.20)
RBC: 3.41 MIL/uL — ABNORMAL LOW (ref 3.80–5.20)
RDW: 16.9 % — ABNORMAL HIGH (ref 11.5–14.5)
RDW: 17.2 % — AB (ref 11.5–14.5)
WBC: 4.2 10*3/uL (ref 3.6–11.0)
WBC: 6 10*3/uL (ref 3.6–11.0)

## 2018-05-24 LAB — PROTIME-INR
INR: 1.18
PROTHROMBIN TIME: 14.9 s (ref 11.4–15.2)

## 2018-05-24 SURGERY — DILATION AND EVACUATION, UTERUS
Anesthesia: General | Site: Vagina

## 2018-05-24 MED ORDER — LIDOCAINE HCL (PF) 2 % IJ SOLN
INTRAMUSCULAR | Status: AC
  Filled 2018-05-24: qty 10

## 2018-05-24 MED ORDER — ONDANSETRON HCL 4 MG/2ML IJ SOLN: 4.0000 mg | Freq: Four times a day (QID) | INTRAMUSCULAR | Status: DC | PRN

## 2018-05-24 MED ORDER — IBUPROFEN 600 MG PO TABS: 600.0000 mg | ORAL_TABLET | Freq: Four times a day (QID) | ORAL | 0 refills | Status: DC | PRN

## 2018-05-24 MED ORDER — ONDANSETRON HCL 4 MG/2ML IJ SOLN
INTRAMUSCULAR | Status: AC
  Filled 2018-05-24: qty 2

## 2018-05-24 MED ORDER — DOCUSATE SODIUM 100 MG PO CAPS
100.0000 mg | ORAL_CAPSULE | Freq: Two times a day (BID) | ORAL | Status: DC
  Administered 2018-05-24: 100 mg via ORAL
  Filled 2018-05-24: qty 1

## 2018-05-24 MED ORDER — METHYLERGONOVINE MALEATE 0.2 MG PO TABS
0.2000 mg | ORAL_TABLET | Freq: Once | ORAL | Status: AC
  Administered 2018-05-24: 0.2 mg via ORAL
  Filled 2018-05-24: qty 1

## 2018-05-24 MED ORDER — MENTHOL 3 MG MT LOZG
1.0000 | LOZENGE | OROMUCOSAL | Status: DC | PRN
  Filled 2018-05-24: qty 9

## 2018-05-24 MED ORDER — SUCCINYLCHOLINE CHLORIDE 20 MG/ML IJ SOLN
INTRAMUSCULAR | Status: AC
  Filled 2018-05-24: qty 1

## 2018-05-24 MED ORDER — ONDANSETRON HCL 4 MG PO TABS: 4.0000 mg | ORAL_TABLET | Freq: Four times a day (QID) | ORAL | Status: DC | PRN

## 2018-05-24 MED ORDER — METHYLERGONOVINE MALEATE 0.2 MG PO TABS: 0.2000 mg | ORAL_TABLET | Freq: Three times a day (TID) | ORAL | 0 refills | Status: DC

## 2018-05-24 MED ORDER — DEXAMETHASONE SODIUM PHOSPHATE 10 MG/ML IJ SOLN
INTRAMUSCULAR | Status: AC
  Filled 2018-05-24: qty 1

## 2018-05-24 MED ORDER — PROPOFOL 10 MG/ML IV BOLUS
INTRAVENOUS | Status: AC
  Filled 2018-05-24: qty 20

## 2018-05-24 MED ORDER — SIMETHICONE 80 MG PO CHEW: 80.0000 mg | CHEWABLE_TABLET | Freq: Four times a day (QID) | ORAL | Status: DC | PRN

## 2018-05-24 MED ORDER — METHYLERGONOVINE MALEATE 0.2 MG PO TABS
0.2000 mg | ORAL_TABLET | Freq: Three times a day (TID) | ORAL | Status: DC
  Administered 2018-05-24 (×2): 0.2 mg via ORAL
  Filled 2018-05-24 (×3): qty 1

## 2018-05-24 MED ORDER — SILVER NITRATE-POT NITRATE 75-25 % EX MISC
CUTANEOUS | Status: DC | PRN
  Administered 2018-05-24: 9

## 2018-05-24 MED ORDER — IBUPROFEN 600 MG PO TABS: 600.0000 mg | ORAL_TABLET | Freq: Four times a day (QID) | ORAL | Status: DC | PRN

## 2018-05-24 MED ORDER — MIDAZOLAM HCL 2 MG/2ML IJ SOLN
INTRAMUSCULAR | Status: AC
  Filled 2018-05-24: qty 2

## 2018-05-24 MED ORDER — HYDROMORPHONE HCL 1 MG/ML IJ SOLN: 1.0000 mg | INTRAMUSCULAR | Status: DC | PRN

## 2018-05-24 MED ORDER — LACTATED RINGERS IV SOLN: INTRAVENOUS | Status: DC

## 2018-05-24 MED ORDER — FENTANYL CITRATE (PF) 100 MCG/2ML IJ SOLN
INTRAMUSCULAR | Status: AC
  Filled 2018-05-24: qty 2

## 2018-05-24 SURGICAL SUPPLY — 20 items
BAG URINE DRAINAGE (UROLOGICAL SUPPLIES) IMPLANT
CATH FOLEY 2WAY  5CC 16FR (CATHETERS)
CATH ROBINSON RED A/P 16FR (CATHETERS) ×2 IMPLANT
CATH URTH 16FR FL 2W BLN LF (CATHETERS) IMPLANT
FILTER UTR ASPR SPEC (MISCELLANEOUS) ×1 IMPLANT
FLTR UTR ASPR SPEC (MISCELLANEOUS) ×2
GLOVE BIO SURGEON STRL SZ7 (GLOVE) ×8 IMPLANT
GOWN STRL REUS W/ TWL LRG LVL3 (GOWN DISPOSABLE) ×2 IMPLANT
GOWN STRL REUS W/TWL LRG LVL3 (GOWN DISPOSABLE) ×2
KIT BERKELEY 1ST TRIMESTER 3/8 (MISCELLANEOUS) ×4 IMPLANT
KIT TURNOVER CYSTO (KITS) ×2 IMPLANT
NS IRRIG 500ML POUR BTL (IV SOLUTION) ×2 IMPLANT
PACK DNC HYST (MISCELLANEOUS) ×2 IMPLANT
PAD OB MATERNITY 4.3X12.25 (PERSONAL CARE ITEMS) ×4 IMPLANT
PAD PREP 24X41 OB/GYN DISP (PERSONAL CARE ITEMS) ×2 IMPLANT
SET BERKELEY SUCTION TUBING (SUCTIONS) ×4 IMPLANT
TOWEL OR 17X26 4PK STRL BLUE (TOWEL DISPOSABLE) ×2 IMPLANT
VACURETTE 10 RIGID CVD (CANNULA) ×2 IMPLANT
VACURETTE 12 RIGID CVD (CANNULA) IMPLANT
VACURETTE 8 RIGID CVD (CANNULA) ×2 IMPLANT

## 2018-05-24 NOTE — H&P (Signed)
GYNECOLOGY CONSULT NOTE  GYN Consultation  Attending Provider: Merrily BrittleNeil Rifenbark, MD  Taylor HarrowSabrina Crear [id number] 05/24/2018 5:58 AM    Reason for Consultation:   Taylor Pierce is a 22 y.o. [id number] female seen at the request of Dr. Lamont Snowballifenbark for evaluation of retained products of conception with vaginal hemorrhage.    History of Present Ilness:   22 y.o. 133P1021 female who [facility].  She has had very little in the way of bleeding until 330 pm this afternoon. She began passing quarter sized clots and lots of blood.  She has gone through 5 pads over about 7 hours.  She notes continued passage of clots and bleeding. She denies fevers and chills and abdominal pain apart from mild cramping.  Nothing makes the bleeding better or worse.     Past Medical History:  Diagnosis Date  . Anemia   . Guillain-Barre [facility](HCC) age 86   Past Surgical History:  Procedure Laterality Date  . NO PAST SURGERIES     Allergies  Allergen Reactions  . Macrobid [Nitrofurantoin Macrocrystal] Itching  . Other Other (See Comments)    IV pain medication, cannot remember name of drug   Prior to Admission medications: denies   Obstetric History: She is a 943P1021 female s/p SVD with G1, G2 was an EAB in March this year and G3 as noted above.   Social History:  She  reports that she has never smoked. She has never used smokeless tobacco. She reports that she drinks alcohol. She reports that she does not use drugs.  Family History:  Diabetes and hypertension on both sides of the family. Denies history of gynecologic malignancies  Review of Systems:   Review of Systems  Constitutional: Negative.   HENT: Negative.   Eyes: Negative.   Respiratory: Negative.   Cardiovascular: Negative.   Gastrointestinal: Negative.   Genitourinary: Negative.        See HPI  Musculoskeletal: Negative.   Skin: Negative.   Neurological: Negative.   Psychiatric/Behavioral:  Negative.      Objective    BP (!) 135/95   Pulse 72   Temp 98.7 F (37.1 C) (Oral)   Resp 17   Ht 5\' 3"  (1.6 m)   Wt 47.2 kg   SpO2 100%   BMI 18.42 kg/m  Physical Exam  Constitutional: She is oriented to person, place, and time. She appears well-developed and well-nourished. No distress.  HENT:  Head: Normocephalic and atraumatic.  Eyes: Conjunctivae are normal. No scleral icterus.  Neck: Normal range of motion. Neck supple. No thyromegaly present.  Cardiovascular: Normal rate and regular rhythm. Exam reveals no gallop and no friction rub.  No murmur heard. Pulmonary/Chest: Effort normal and breath sounds normal. No respiratory distress. She has no wheezes. She has no rales.  Abdominal: Soft. Bowel sounds are normal. She exhibits mass (uterine fundus at about 3cm below umbilicus). She exhibits no distension. There is tenderness (mild ttp over uterus. ). There is no guarding.  Musculoskeletal: Normal range of motion. She exhibits no edema.  Neurological: She is alert and oriented to person, place, and time. No cranial nerve deficit.  Skin: Skin is warm and dry. No erythema.  Psychiatric: She has a normal mood and affect. Her behavior is normal. Judgment normal.    Lab Results  Component Value Date   WBC 4.2 05/24/2018   RBC 2.70 (L) 05/24/2018   HGB 7.4 (L) 05/24/2018   HCT 22.5 (L) 05/24/2018  PLT 163 05/24/2018   NA 137 05/23/2018   K 3.9 05/23/2018   CREATININE 0.58 05/23/2018    Imaging Results: US Ob Comp < 14 Wks  Result Date: 05/24/2018 CLINICAL DATA:  Patient had D and E 1 week ago. Increasing vaginal bleeding and pelvic pain. EXAM: OBSTETRIC <14 WK [place] AND TRANSVAGINAL OB US TECHNIQUE: Both transabdominal and transvaginal ultrasound examinations were performed for complete evaluation of the gestation as well as the maternal uterus, adnexal regions, and pelvic cul-de-sac. Transvaginal technique was performed to assess early pregnancy. COMPARISON:  None. FINDINGS:  Intrauterine gestational sac: None Yolk sac:  Not Visualized. Embryo:  Not Visualized. Cardiac Activity: Not Visualized. Maternal uterus/adnexae: Uterus is markedly thickened measuring 5.5 cm and diffusely heterogeneous. No definite endometrial vascularity. Both ovaries are visualized and appear normal. Trace pelvic free fluid. IMPRESSION: Markedly thickened heterogeneous endometrium with a thickness of 5.5 cm. While there is no internal vascularity, the degree of endometrial thickening and heterogeneity is concerning for retained products of conception. Electronically Signed   By: Narda Rutherford M.D.   On: 05/24/2018 00:59   US Ob Transvaginal  Result Date: 05/24/2018 CLINICAL DATA:  Patient had D and E 1 week ago. Increasing vaginal bleeding and pelvic pain. EXAM: OBSTETRIC <14 WK [place] AND TRANSVAGINAL OB US TECHNIQUE: Both transabdominal and transvaginal ultrasound examinations were performed for complete evaluation of the gestation as well as the maternal uterus, adnexal regions, and pelvic cul-de-sac. Transvaginal technique was performed to assess early pregnancy. COMPARISON:  None. FINDINGS: Intrauterine gestational sac: None Yolk sac:  Not Visualized. Embryo:  Not Visualized. Cardiac Activity: Not Visualized. Maternal uterus/adnexae: Uterus is markedly thickened measuring 5.5 cm and diffusely heterogeneous. No definite endometrial vascularity. Both ovaries are visualized and appear normal. Trace pelvic free fluid. IMPRESSION: Markedly thickened heterogeneous endometrium with a thickness of 5.5 cm. While there is no internal vascularity, the degree of endometrial thickening and heterogeneity is concerning for retained products of conception. Electronically Signed   By: Narda Rutherford M.D.   On: 05/24/2018 00:59      Assessment & Recommendations   Daryl Chesnut is a 22 y.o. G4P1021 female with likely vaginal hemorrhage of uterine origin, likely due to retained products of conception.     To OR for  Suction, dilation and curettage.  Discussed risks and benefits of procedure and risks and benefits of observation. I recommended surgery and the patient agrees.  She has been NPO since about 230PM yesterday.   Thomasene Mohair, MD 05/24/2018 5:58 AM

## 2018-05-24 NOTE — Discharge Summary (Addendum)
DC Summary Discharge Summary   Patient ID: Taylor HarrowSabrina Pierce [id number] 22 y.o. 1996/08/23  Admit date: 05/23/2018  Discharge date: 05/24/2018  Principal Diagnoses:  Retained products of conception Uterine hemorrhage due to retained products of conception after procedure  Secondary Diagnoses:  none  Procedures performed during the hospitalization:  Suction, dilation and curettage  History of Present Ilness:   22 y.o. [id number] female who [facility].  She has had very little in the way of bleeding until 330 pm this afternoon. She began passing quarter sized clots and lots of blood.  She has gone through 5 pads over about 7 hours.  She notes continued passage of clots and bleeding. She denies fevers and chills and abdominal pain apart from mild cramping.  Nothing makes the bleeding better or worse.    Past Medical History:  Diagnosis Date  . Anemia   . Guillain-Barre [facility](HCC) age 22    Past Surgical History:  Procedure Laterality Date  . NO PAST SURGERIES      Allergies  Allergen Reactions  . Macrobid [Nitrofurantoin Macrocrystal] Itching  . Other Other (See Comments)    IV pain medication, cannot remember name of drug    Social History   Tobacco Use  . Smoking status: Never Smoker  . Smokeless tobacco: Never Used  Substance Use Topics  . Alcohol use: Yes  . Drug use: No   Family History:  Diabetes and hypertension on both sides of the family. Denies history of gynecologic malignancies  Hospital Course:  The patient was admitted from the Emergency Department and taken to the operating room where she underwent a suction, dilation and curettage.  The procedure was without complication, though she did have what was considered to be a large volume blood loss (700 mL). She was monitored postoperatively for continued resolution of bleeding and to ensure she did not have a symptomatic anemia. Postoperatively she did well. She  was able to ambulate without difficulty, void spontaneously. Her pain was well controlled.  She was tolerating a PO diet. She had no symptoms of anemia. Her vitals signs were stable and her bleeding was within the expected amount for the procedure she underwent.  She was, therefore, deemed to be a good candidate for discharge.   Discharge Exam: BP 114/76 (BP Location: Left Arm)   Pulse 87   Temp 98.8 F (37.1 C) (Oral)   Resp 18   Ht 5\' 3"  (1.6 m)   Wt 47.2 kg   SpO2 100%   BMI 18.42 kg/m  Physical Exam  Constitutional: She is oriented to person, place, and time. She appears well-developed and well-nourished. No distress.  HENT:  Head: Normocephalic and atraumatic.  Eyes: Conjunctivae are normal. No scleral icterus.  Cardiovascular: Normal rate and regular rhythm.  Pulmonary/Chest: Effort normal and breath sounds normal. No respiratory distress. She has no wheezes. She has no rales.  Abdominal: Soft. Bowel sounds are normal. She exhibits no distension and no mass. There is no tenderness. There is no rebound and no guarding.  Musculoskeletal: Normal range of motion. She exhibits no edema.  Neurological: She is alert and oriented to person, place, and time. No cranial nerve deficit.  Skin: Skin is dry. No erythema.  Psychiatric: She has a normal mood and affect. Her behavior is normal. Judgment normal.      Condition at Discharge: Stable  Complications affecting treatment: None  Discharge Medications:  Allergies as of 05/24/2018  Reactions   Macrobid [nitrofurantoin Macrocrystal] Itching   Other Other (See Comments)   IV pain medication, cannot remember name of drug      Medication List    STOP taking these medications   gabapentin 300 MG capsule Commonly known as:  NEURONTIN     TAKE these medications   ibuprofen 600 MG tablet Commonly known as:  ADVIL,MOTRIN Take 1 tablet (600 mg total) by mouth every 6 (six) hours as needed (mild pain). What changed:    when  to take this  reasons to take this   methylergonovine 0.2 MG tablet Commonly known as:  METHERGINE Take 1 tablet (0.2 mg total) by mouth 3 (three) times daily for 3 days.       Follow-up Information    Conard Novak, MD. Schedule an appointment as soon as possible for a visit in 2 week(s).   Specialty:  Obstetrics and Gynecology Why:  post op follow up Contact information: [address] [place] [address] [phone]          Discharge Disposition: Home to self care  Signed: Thomasene Mohair, MD 05/24/2018 3:04 PM

## 2018-05-24 NOTE — Progress Notes (Signed)
  May 24, 2018  Patient: Taylor Pierce Salo  Date of Birth: 05-25-1996  Date of Visit: 05/23/2018    To Whom It May Concern:  Taylor Pierce Babe [facility] on 05/23/2018-05/24/2018. Taylor Pierce Gruenhagen may return to work/school 05/28/2018.  Sincerely,   Dr. Thomasene MohairStephen Jackson  [phone]

## 2018-05-24 NOTE — Op Note (Signed)
Operative Note   05/24/2018  PRE-OP DIAGNOSIS: retained products of conception    POST-OP DIAGNOSIS: retained products of conception    SURGEON: Surgeon(s) and Role:    Conard Novak* Daymion Nazaire D, MD - Primary  PROCEDURE: Procedure(s): Suction, Dilation and Curettage  ANESTHESIA: General   ESTIMATED BLOOD LOSS: 700 mL  DRAINS: none   TOTAL IV FLUIDS: 1,400 mL  SPECIMENS:  Products of conception  VTE PROPHYLAXIS: SCDs to the bilateral lower extremities  ANTIBIOTICS: Doxycyclin 200 mg IV prior to start of case  COMPLICATIONS: none  DISPOSITION: PACU - hemodynamically stable.  CONDITION: stable  INDICATION: 22 y.o. 633P1021 female who is 5 days post-op from an elective termination D&E at 16 weeks. She presented with sudden onset heavy vaginal bleeding. The pelvic ultrasound shows a large amount of clot versus some retained products of conception  FINDINGS: Exam under anesthesia revealed uterus to about 3cm below the umbilicus with no masses and bilateral adnexa without masses or fullness.   PROCEDURE IN DETAIL:  After informed consent was confirmed, the patient was taken to the operating room where general anesthesia was induced.  Her legs were carefully placed in the candy cane stirrups.  She was prepped and draped in the standard fashion.  A speculum was placed in the vagina and the cervix was prepped with betadine.   A tenaculum was placed on the anterior lip of the cervix.  The cervix was already dilated to 12 mm.  The 12 mm suction curette was advanced to the uterine fundus.  Three passes were made with the suction curette with evacuation of adequate tissue noted.  Two passes were made using sharp curettage until a gritty texture was noted.  One final pass was made with the suction curette.  At this point she continued to have an excessive amount of bleeding.  A foley catheter with a 30 mL balloon was advanced into the uterus and the balloon filled with 30 mL sterile saline. This  remained in place for about 40 minutes with initially 2-3 drips per second, slowing down gradually to no bleeding. The catheter tubing was flushed with sterile saline. She continued to have no bleeding from the catheter.  The catheter was removed slowly (draining 5 mL every 1 minute or so).  After removal of the catheter continued hemostasis was noted.  All instruments were removed from the vagina and the cervix was noted to be hemostatic after removal of the tenaculum.    The patient tolerated the procedure well. Sponge, lap, and needle counts correct times two. She received doxycycline 200 mg prior to the start of the case.  She was awakened in the OR and taken to the PACU in stable condition.   Conard NovakStephen D. Murlene Revell, MD, [facility] 05/24/2018 6:19 AM

## 2018-05-24 NOTE — ED Notes (Signed)
Patient transported to Ultrasound 

## 2018-05-24 NOTE — Progress Notes (Signed)
Patient discharged home with mother. Discharge instructions and prescriptions given and reviewed with patient. Methergine medication handed to patient provided by [facility]. Patient verbalized understanding of all discharge instructions. Escorted out by staff.

## 2018-05-24 NOTE — Care Management (Signed)
Patient status post D&C.  Patient to discharge on Methergine.  MD has updated orders as follows:  methylergonovine 0.2 MG tablet Commonly known as:  METHERGINE Take 1 tablet (0.2 mg total) by mouth 3 (three) times daily for 3 days.    [facility] both pharmacies do not have rx in stock.  [facility]  9 tablet to [facility] prior to discharge.  Medication will be tubed to floor.

## 2018-05-24 NOTE — Progress Notes (Signed)
   05/24/18 1325  Clinical Encounter Type  Visited With Patient and family together  Visit Type Initial   Chaplain made introductory visit.  Patient and visitor expressed no need for chaplain services at this time.  Chaplain spoke of ongoing availability if needs changed.

## 2018-05-24 NOTE — OR Nursing (Signed)
D/t downtime electronic intra-op noted transferred at end of shift from paper chart record that was  transferred with patient to PACU.

## 2018-05-25 ENCOUNTER — Encounter: Payer: Self-pay | Admitting: Obstetrics and Gynecology

## 2018-05-28 LAB — SURGICAL PATHOLOGY

## 2018-06-06 ENCOUNTER — Encounter: Payer: Self-pay | Admitting: Obstetrics and Gynecology

## 2018-06-06 ENCOUNTER — Ambulatory Visit (INDEPENDENT_AMBULATORY_CARE_PROVIDER_SITE_OTHER): Payer: 59 | Admitting: Obstetrics and Gynecology

## 2018-06-06 VITALS — BP 110/64 | Ht 63.0 in | Wt 104.0 lb

## 2018-06-06 DIAGNOSIS — Z09 Encounter for follow-up examination after completed treatment for conditions other than malignant neoplasm: Secondary | ICD-10-CM

## 2018-06-06 NOTE — Progress Notes (Signed)
   Postoperative Follow-up Patient presents post op from suction, dilation and curettage 2 weeks ago for retained products of conception after procedure for elective termination of pregnancy.  Subjective: Patient reports marked improvement in her preop symptoms. Eating a regular diet without difficulty. The patient is not having any pain.  Activity: normal activities of daily living.  Objective: Vitals:   06/06/18 1023  BP: 110/64   Vital Signs: BP 110/64   Ht 5\' 3"  (1.6 m)   Wt 104 lb (47.2 kg)   BMI 18.42 kg/m  Constitutional: Well nourished, well developed female in no acute distress.  HEENT: normal Skin: Warm and dry.  Extremity: no edema  Abdomen: Soft, non-tender, normal bowel sounds; no bruits, organomegaly or masses.   Assessment: 22 y.o. s/p suction, dilation and evacution progressing well  Plan: Patient has done well after surgery with no apparent complications.  I have discussed the post-operative course to date, and the expected progress moving forward.  The patient understands what complications to be concerned about.  I will see the patient in routine follow up, or sooner if needed.    Activity plan: No restriction.  A lengthy discussion was held with the patient regarding the need for good birth control in the setting of two terminations in a six month time frame.  The emphasis was really on her health as these procedures and their complications pose a danger to her, if utilized as her sole source of contraception. All forms of contraception reviewed. She declined any form of contraception containing hormones. We discussed Paragard in great detail. She is quite interested in this form of contraception. But, would like to think about it. She will let me know. I strongly encouraged her to use condoms in the mean time.   Thomasene Mohair, MD 06/06/2018, 10:41 AM

## 2018-08-24 ENCOUNTER — Emergency Department (HOSPITAL_COMMUNITY)
Admission: EM | Admit: 2018-08-24 | Discharge: 2018-08-24 | Disposition: A | Discharge status: Home / Self Care | Payer: 59 | Attending: Emergency Medicine | Admitting: Emergency Medicine

## 2018-08-24 ENCOUNTER — Emergency Department (HOSPITAL_COMMUNITY): Payer: 59

## 2018-08-24 ENCOUNTER — Encounter (HOSPITAL_COMMUNITY): Payer: Self-pay | Admitting: Obstetrics and Gynecology

## 2018-08-24 ENCOUNTER — Other Ambulatory Visit: Payer: Self-pay

## 2018-08-24 DIAGNOSIS — S0990XA Unspecified injury of head, initial encounter: Secondary | ICD-10-CM | POA: Insufficient documentation

## 2018-08-24 DIAGNOSIS — Y999 Unspecified external cause status: Secondary | ICD-10-CM | POA: Diagnosis not present

## 2018-08-24 DIAGNOSIS — Y92410 Unspecified street and highway as the place of occurrence of the external cause: Secondary | ICD-10-CM | POA: Diagnosis not present

## 2018-08-24 DIAGNOSIS — Z79899 Other long term (current) drug therapy: Secondary | ICD-10-CM | POA: Diagnosis not present

## 2018-08-24 DIAGNOSIS — Y939 Activity, unspecified: Secondary | ICD-10-CM | POA: Insufficient documentation

## 2018-08-24 NOTE — Discharge Instructions (Signed)
HEAD INJURY °If any of the following occur notify your physician or go to °the Hospital Emergency Department: ° Increased drowsiness, stupor or loss of consciousness ° Restlessness or convulsions (fits) ° Paralysis in arms or legs ° Temperature above 100º F ° Vomiting ° Severe headache ° Blood or clear fluid dripping from the nose or ears ° Stiffness of the neck ° Dizziness or blurred vision ° Pulsating pain in the eye ° Unequal pupils of eye ° Personality changes ° Any other unusual symptoms °PRECAUTIONS ° Do not take tranquilizers, sedatives, narcotics or alcohol ° Avoid aspirin. Use only acetaminophen (e.g. Tylenol) or °ibuprofen (e.g. Advil) for relief of pain. Follow directions °on the bottle for dosage. ° Use ice packs for comfort ° Getting plenty of rest and sleep helps the brain to heal. Do not try to do too much too fast. As you start to feel better, you can slowly and gradually return to your usual routine. ° Avoid activities that are physically demanding (e.g., sports, heavy housecleaning, exercising) or require a lot of thinking or concentration (e.g., working on the computer, playing video games). ° Ask your health care professional when you can safely drive a car, ride a bike, or operate heavy equipment. °MEDICATIONS °Use medications only as directed by your physician ° °Follow up with your primary care provider within 5-7 days for re-evaluation.  ° °
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
## 2019-08-20 ENCOUNTER — Encounter: Payer: Self-pay | Admitting: Occupational Therapy

## 2019-08-20 ENCOUNTER — Other Ambulatory Visit: Payer: Self-pay

## 2019-08-20 ENCOUNTER — Ambulatory Visit: Payer: No Typology Code available for payment source | Attending: Psychology | Admitting: Occupational Therapy

## 2019-08-20 DIAGNOSIS — R278 Other lack of coordination: Secondary | ICD-10-CM | POA: Insufficient documentation

## 2019-08-20 DIAGNOSIS — R209 Unspecified disturbances of skin sensation: Secondary | ICD-10-CM | POA: Insufficient documentation

## 2019-08-20 DIAGNOSIS — M6281 Muscle weakness (generalized): Secondary | ICD-10-CM | POA: Diagnosis present

## 2019-08-20 DIAGNOSIS — R2681 Unsteadiness on feet: Secondary | ICD-10-CM | POA: Insufficient documentation

## 2019-08-20 DIAGNOSIS — R2689 Other abnormalities of gait and mobility: Secondary | ICD-10-CM | POA: Diagnosis present

## 2019-08-20 DIAGNOSIS — R208 Other disturbances of skin sensation: Secondary | ICD-10-CM
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
## 2019-08-22 ENCOUNTER — Ambulatory Visit: Payer: No Typology Code available for payment source

## 2019-08-22 ENCOUNTER — Other Ambulatory Visit: Payer: Self-pay

## 2019-08-22 DIAGNOSIS — M6281 Muscle weakness (generalized): Secondary | ICD-10-CM

## 2019-08-22 DIAGNOSIS — R2689 Other abnormalities of gait and mobility: Secondary | ICD-10-CM
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
## 2019-09-03 ENCOUNTER — Ambulatory Visit: Payer: No Typology Code available for payment source | Admitting: Occupational Therapy

## 2019-09-17 ENCOUNTER — Ambulatory Visit: Payer: No Typology Code available for payment source | Admitting: Occupational Therapy

## 2019-09-18 ENCOUNTER — Ambulatory Visit
Payer: No Typology Code available for payment source | Attending: Physical Medicine and Rehabilitation | Admitting: Occupational Therapy

## 2019-09-24 ENCOUNTER — Ambulatory Visit: Payer: No Typology Code available for payment source | Admitting: Occupational Therapy

## 2020-03-23 ENCOUNTER — Emergency Department: Payer: No Typology Code available for payment source

## 2020-03-23 ENCOUNTER — Other Ambulatory Visit: Payer: Self-pay

## 2020-03-23 ENCOUNTER — Emergency Department
Admission: EM | Admit: 2020-03-23 | Discharge: 2020-03-23 | Disposition: A | Discharge status: Home / Self Care | Payer: No Typology Code available for payment source | Attending: Emergency Medicine | Admitting: Emergency Medicine

## 2020-03-23 ENCOUNTER — Encounter: Payer: Self-pay | Admitting: Emergency Medicine

## 2020-03-23 DIAGNOSIS — S7001XA Contusion of right hip, initial encounter: Secondary | ICD-10-CM | POA: Insufficient documentation

## 2020-03-23 DIAGNOSIS — S7002XA Contusion of left hip, initial encounter: Secondary | ICD-10-CM

## 2020-03-23 DIAGNOSIS — S7011XA Contusion of right thigh, initial encounter: Secondary | ICD-10-CM

## 2020-03-23 DIAGNOSIS — S39012A Strain of muscle, fascia and tendon of lower back, initial encounter: Secondary | ICD-10-CM | POA: Diagnosis not present

## 2020-03-23 DIAGNOSIS — Y92410 Unspecified street and highway as the place of occurrence of the external cause: Secondary | ICD-10-CM | POA: Diagnosis not present

## 2020-03-23 DIAGNOSIS — Y939 Activity, unspecified: Secondary | ICD-10-CM | POA: Diagnosis not present

## 2020-03-23 DIAGNOSIS — S79911A Unspecified injury of right hip, initial encounter: Secondary | ICD-10-CM | POA: Diagnosis present

## 2020-03-23 DIAGNOSIS — Y999 Unspecified external cause status: Secondary | ICD-10-CM | POA: Diagnosis not present

## 2020-03-23 DIAGNOSIS — S7012XA Contusion of left thigh, initial encounter: Secondary | ICD-10-CM

## 2020-03-23 HISTORY — DX: Polyneuropathy, unspecified: G62.9

## 2020-03-23 HISTORY — DX: Degenerative disease of nervous system, unspecified: G31.9

## 2020-03-23 LAB — POCT PREGNANCY, URINE: Preg Test, Ur: NEGATIVE

## 2020-03-23 MED ORDER — METHOCARBAMOL 500 MG PO TABS: 500.0000 mg | ORAL_TABLET | Freq: Four times a day (QID) | ORAL | 0 refills | Status: DC

## 2020-03-23 MED ORDER — MELOXICAM 15 MG PO TABS: 15.0000 mg | ORAL_TABLET | Freq: Every day | ORAL | 0 refills | Status: DC
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
## 2020-11-28 ENCOUNTER — Encounter (HOSPITAL_COMMUNITY): Payer: Self-pay | Admitting: Emergency Medicine

## 2020-11-28 ENCOUNTER — Emergency Department (HOSPITAL_COMMUNITY)
Admission: EM | Admit: 2020-11-28 | Discharge: 2020-11-28 | Disposition: A | Discharge status: Home / Self Care | Payer: No Typology Code available for payment source | Attending: Emergency Medicine | Admitting: Emergency Medicine

## 2020-11-28 DIAGNOSIS — H1032 Unspecified acute conjunctivitis, left eye: Secondary | ICD-10-CM | POA: Insufficient documentation

## 2020-11-28 DIAGNOSIS — H53142 Visual discomfort, left eye: Secondary | ICD-10-CM | POA: Diagnosis present

## 2020-11-28 MED ORDER — FLUORESCEIN SODIUM 1 MG OP STRP
1.0000 | ORAL_STRIP | Freq: Once | OPHTHALMIC | Status: AC
  Administered 2020-11-28: 1 via OPHTHALMIC
  Filled 2020-11-28: qty 1

## 2020-11-28 MED ORDER — TETRACAINE HCL 0.5 % OP SOLN
2.0000 [drp] | Freq: Once | OPHTHALMIC | Status: AC
  Administered 2020-11-28: 2 [drp] via OPHTHALMIC
  Filled 2020-11-28: qty 4

## 2020-11-28 MED ORDER — IBUPROFEN 800 MG PO TABS
800.0000 mg | ORAL_TABLET | Freq: Three times a day (TID) | ORAL | 0 refills | Status: DC
Start: 2020-11-28 — End: 2021-12-30

## 2020-11-28 MED ORDER — ERYTHROMYCIN 5 MG/GM OP OINT
TOPICAL_OINTMENT | OPHTHALMIC | 0 refills | Status: DC
Start: 2020-11-28 — End: 2023-07-02

## 2020-11-28 MED ORDER — ERYTHROMYCIN 5 MG/GM OP OINT
1.0000 "application " | TOPICAL_OINTMENT | Freq: Once | OPHTHALMIC | Status: AC
  Administered 2020-11-28: 1 via OPHTHALMIC
  Filled 2020-11-28: qty 3.5

## 2020-11-28 NOTE — ED Notes (Signed)
Visual Acuity-10/20 right eye, blurry left eye all letters/lines.

## 2020-11-28 NOTE — ED Provider Notes (Signed)
[facility] EMERGENCY DEPARTMENT Provider Note   CSN: [id number] Arrival date & time: 11/28/20  1440     History No chief complaint on file.   Taylor Pierce is a 25 y.o. female.  HPI Patient reports a week ago she had some irritation in her left eye.  She thought maybe a lash had gotten stuck in it and had rubbed it quite a bit and tried to irrigate it.  She reports for a couple of days it seemed more swollen and irritated but then was little better for the next day or 2.  She reports this morning however there was more swelling of the lid and it felt again very irritated underneath the lid like there is something stuck.  Reports has been drainage and her vision has been slightly hazy.  Reports she has some antibiotics left from an episode of pinkeye.  She only used 1 drop to irrigate her eye and did not use it consistently.  He has had some improvement in pain with use of ibuprofen.    Past Medical History:  Diagnosis Date  . Anemia   . Cerebellar atrophy (HCC)   . 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 Neuropathy     Patient Active Problem List   Diagnosis Date Noted  . Retained products of conception with hemorrhage 05/24/2018    Past Surgical History:  Procedure Laterality Date  . DILATION AND EVACUATION N/A 05/24/2018   Procedure: DILATATION AND EVACUATION;  Surgeon: Conard Novak, MD;  Location: [facility] ORS;  Service: Gynecology;  Laterality: N/A;  . NO PAST SURGERIES       OB History    Gravida  1   Para      Term      Preterm      AB      Living        SAB      IAB      Ectopic      Multiple      Live Births              No family history on file.  Social History   Tobacco Use  . Smoking status: Never Smoker  . Smokeless tobacco: Never Used  Vaping Use  . Vaping Use: Never used  Substance Use Topics  . Alcohol use: Yes  . Drug use: Not Currently    Frequency: 2.0 times per week    Types: Marijuana    Home  Medications Prior to Admission medications   Medication Sig Start Date End Date Taking? Authorizing Provider  erythromycin ophthalmic ointment Place a 1/2 inch ribbon of ointment into the lower eyelid every 3-4 hours while awake for 7 days. 11/28/20  Yes Arby Barrette, MD  ibuprofen (ADVIL) 800 MG tablet Take 1 tablet (800 mg total) by mouth 3 (three) times daily. 11/28/20  Yes Arby Barrette, MD  GABAPENTIN PO Take by mouth.    [provider]  ibuprofen (ADVIL,MOTRIN) 600 MG tablet Take 1 tablet (600 mg total) by mouth every 6 (six) hours as needed (mild pain). 05/24/18   Conard Novak, MD  meloxicam (MOBIC) 15 MG tablet Take 1 tablet (15 mg total) by mouth daily. 03/23/20   Cuthriell, Delorise Royals, PA-C  methocarbamol (ROBAXIN) 500 MG tablet Take 1 tablet (500 mg total) by mouth 4 (four) times daily. 03/23/20   Cuthriell, Delorise Royals, PA-C  methylergonovine (METHERGINE) 0.2 MG tablet Take 1 tablet (0.2 mg total) by mouth 3 (three)  times daily. Patient not taking: Reported on 08/20/2019 05/24/18   Conard Novak, MD    Allergies    Macrobid [nitrofurantoin macrocrystal] and Other  Review of Systems   Review of Systems Constitutional: No fever no chills no malaise Neurologic: No headache no confusion no imbalance Physical Exam Updated Vital Signs BP 129/86 (BP Location: Right Arm)   Pulse 75   Temp 98.3 F (36.8 C) (Oral)   Resp 14   LMP 10/29/2020   SpO2 100%   Physical Exam Constitutional:      Comments: Alert nontoxic clinically well in appearance.  HENT:     Head: Normocephalic and atraumatic.     Mouth/Throat:     Pharynx: Oropharynx is clear.  Eyes:     Comments: Pupils are symmetric 3 mm and responsive.  Patient has diffuse injection of the left conjunctiva.  Mild diffuse swelling of the upper lid.  Slight crusting in the lashes.  No thick purulent drainage.  Eyelid is everted and examined with magnification.  No retained foreign body.  Possibly small  irritated area where the mucosa appears a little irregular.  No large swelling and no retained foreign bodies.  No fluorescein uptake.  Slit-lamp does not show any cloudiness or appearance of cells in the anterior chamber.  Pulmonary:     Effort: Pulmonary effort is normal.  Skin:    General: Skin is warm and dry.  Neurological:     General: No focal deficit present.     Mental Status: She is oriented to person, place, and time.     Coordination: Coordination normal.  Psychiatric:        Mood and Affect: Mood normal.     ED Results / Procedures / Treatments   Labs (all labs ordered are listed, but only abnormal results are displayed) Labs Reviewed - No data to display  EKG None  Radiology No results found.  Procedures Procedures   Medications Ordered in ED Medications  erythromycin ophthalmic ointment 1 application (has no administration in time range)  tetracaine (PONTOCAINE) 0.5 % ophthalmic solution 2 drop (2 drops Left Eye Given 11/28/20 2057)  fluorescein ophthalmic strip 1 strip (1 strip Left Eye Given 11/28/20 2057)    ED Course  I have reviewed the triage vital signs and the nursing notes.  Pertinent labs & imaging results that were available during my care of the patient were reviewed by me and considered in my medical decision making (see chart for details).    MDM Rules/Calculators/A&P                          Patient presents with diffuse conjunctival inflammation.  Course has been waxing and waning for about a week with foreign body sensation.  There is mild lid swelling.  No focus to suggest a hordeolum.  Some crusting in the lashes.  Patient reports there was more crusting and matting earlier.  It was are symmetric.  No appearance of inflammatory cells in the anterior chamber.  No abrasions or ulcerations to the cornea at this time will start erythromycin ophthalmic ointment.  Patient is to take ibuprofen for pain.  She is advised to follow-up with  ophthalmology as soon as possible.  She reports she does have her own ophthalmologist.  She is also given on-call ophthalmology Dr. Charlotte Sanes for alternative follow-up plan. Final Clinical Impression(s) / ED Diagnoses Final diagnoses:  Acute conjunctivitis of left eye, unspecified acute conjunctivitis type  Rx / DC Orders ED Discharge Orders         Ordered    erythromycin ophthalmic ointment        11/28/20 2118    ibuprofen (ADVIL) 800 MG tablet  3 times daily        11/28/20 2118           Arby Barrette, MD 11/28/20 2127

## 2020-11-28 NOTE — Discharge Instructions (Signed)
1.  Use the antibiotic ointment erythromycin, every 3-4 hours for the next 7 days. 2.  Make an appointment to see your ophthalmologist for recheck by Monday or Tuesday.  If you cannot be seen by your ophthalmologist, Dr. Charlotte Sanes, listed in your discharge instructions is on-call for ophthalmology at [facility].  Call her office Monday morning for a follow-up appointment from the emergency department. 3.  If you are experiencing worsening swelling of your eye, increasing pain, increasing drainage for increasing difficulty with your vision, return to the emergency department immediately.

## 2020-11-28 NOTE — ED Triage Notes (Signed)
C/o foreign body to L eye x 1 week.  States it feels like something is under eyelid scratching eye.  Eye red and swollen.  States it improved with ibuprofen and icepack.

## 2020-12-14 ENCOUNTER — Encounter (HOSPITAL_COMMUNITY): Payer: Self-pay | Admitting: Emergency Medicine

## 2020-12-14 ENCOUNTER — Emergency Department (HOSPITAL_COMMUNITY): Payer: No Typology Code available for payment source

## 2020-12-14 ENCOUNTER — Emergency Department (HOSPITAL_COMMUNITY)
Admission: EM | Admit: 2020-12-14 | Discharge: 2020-12-14 | Disposition: A | Discharge status: Home / Self Care | Payer: No Typology Code available for payment source | Attending: Emergency Medicine | Admitting: Emergency Medicine

## 2020-12-14 ENCOUNTER — Other Ambulatory Visit: Payer: Self-pay

## 2020-12-14 DIAGNOSIS — S93402A Sprain of unspecified ligament of left ankle, initial encounter: Secondary | ICD-10-CM | POA: Insufficient documentation

## 2020-12-14 DIAGNOSIS — G610 Guillain-Barre syndrome: Secondary | ICD-10-CM | POA: Diagnosis not present

## 2020-12-14 DIAGNOSIS — Y999 Unspecified external cause status: Secondary | ICD-10-CM | POA: Insufficient documentation

## 2020-12-14 DIAGNOSIS — S99912A Unspecified injury of left ankle, initial encounter: Secondary | ICD-10-CM | POA: Diagnosis present

## 2020-12-14 DIAGNOSIS — S93492A Sprain of other ligament of left ankle, initial encounter: Secondary | ICD-10-CM

## 2020-12-14 DIAGNOSIS — Y9302 Activity, running: Secondary | ICD-10-CM | POA: Diagnosis not present

## 2020-12-14 DIAGNOSIS — Y9289 Other specified places as the place of occurrence of the external cause: Secondary | ICD-10-CM | POA: Diagnosis not present

## 2020-12-14 DIAGNOSIS — X58XXXA Exposure to other specified factors, initial encounter: Secondary | ICD-10-CM | POA: Insufficient documentation

## 2020-12-14 NOTE — ED Triage Notes (Signed)
Emergency Medicine Provider Triage Evaluation Note  Taylor Pierce , a 25 y.o. female  was evaluated in triage.  Pt complains of pain in her left ankle. Tripped over dogs while they were playing yesterday.  No other injuries. .  Review of Systems  Positive: Left ankle pain and swelling Negative: Head injury, numbness  Physical Exam  BP 139/90 (BP Location: Right Arm)   Pulse 92   Temp 98.6 F (37 C) (Oral)   Resp 16   SpO2 100%  Gen:   Awake, no distress   HEENT:  Atraumatic  Resp:  Normal effort  Cardiac:  Normal rate  Abd:   Nondistended, nontender  MSK:   Moves extremities without difficulty, left ankle edema, able to move toes on left foot.  Neuro:  Speech clear   Medical Decision Making  Medically screening exam initiated at 2:52 PM.  Appropriate orders placed.  Taylor Pierce was informed that the remainder of the evaluation will be completed by another provider, this initial triage assessment does not replace that evaluation, and the importance of remaining in the ED until their evaluation is complete.  Clinical Impression  Left ankle pain   Taylor Pierce, [place] 12/14/20 1453

## 2020-12-14 NOTE — Discharge Instructions (Signed)
Please read and follow all provided instructions.  You have been seen today for ankle sprain.   Tests performed today include: An x-ray of the affected area - does NOT show any broken bones or dislocations.  Vital signs. See below for your results today.   Home care instructions: -- *PRICE in the first 24-48 hours after injury: Protect (with brace, splint, sling), if given by your provider Rest Ice- Do not apply ice pack directly to your skin, place towel or similar between your skin and ice/ice pack. Apply ice for 20 min, then remove for 40 min while awake Compression- Wear brace, elastic bandage, splint as directed by your provider Elevate affected extremity above the level of your heart when not walking around for the first 24-48 hours   Use Ibuprofen (Motrin/Advil) 600mg  every 6 hours as needed for pain (do not exceed max dose in 24 hours, 2400mg )  Follow-up instructions: Please follow-up with your primary care provider or the provided orthopedic physician (bone specialist) if you continue to have significant pain in 1 week. In this case you may have a more severe injury that requires further care.   Return instructions:  Please return if your toes or feet are numb or tingling, appear gray or blue, or you have severe pain (also elevate the leg and loosen splint or wrap if you were given one) Please return to the Emergency Department if you experience worsening symptoms.  Please return if you have any other emergent concerns. Additional Information:  Your vital signs today were: BP 139/90 (BP Location: Right Arm)   Pulse 92   Temp 98.6 F (37 C) (Oral)   Resp 16   SpO2 100%  If your blood pressure (BP) was elevated above 135/85 this visit, please have this repeated by your doctor within one month. ---------------

## 2020-12-14 NOTE — ED Triage Notes (Signed)
Pt here with c/o left ankle pain after falling over some dogs  Yesterday

## 2020-12-14 NOTE — ED Provider Notes (Signed)
[facility] EMERGENCY DEPARTMENT Provider Note   CSN: [id number] Arrival date & time: 12/14/20  1416     History No chief complaint on file.   Taylor Pierce is a 25 y.o. female with pertinent past medical history of anemia, Guillain-Barr, neuropathy that presents emerge department today for fall, left ankle injury.  Patient states that yesterday she was running while running her dogs, inverted her ankle.  Patient states that she was still able to continue running, however when she got home she noticed that her ankle started swelling, started having pain on the lateral aspect of her ankle.  States that the pain has been getting worse.  States that she did take an ibuprofen yesterday which did not seem to help much.  Denies any prior inj today ury to this area.  No blood thinners.  Denies any pain elsewhere.  Not her head..  Denies any numbness or tingling.  States it is very difficult to bear weight.Marland Kitchen HPI     Past Medical History:  Diagnosis Date  . Anemia   . Cerebellar atrophy (HCC)   . Guillain-Barre [facility]) age 86  . Neuropathy     Patient Active Problem List   Diagnosis Date Noted  . Retained products of conception with hemorrhage 05/24/2018    Past Surgical History:  Procedure Laterality Date  . DILATION AND EVACUATION N/A 05/24/2018   Procedure: DILATATION AND EVACUATION;  Surgeon: Conard Novak, MD;  Location: [facility] ORS;  Service: Gynecology;  Laterality: N/A;  . NO PAST SURGERIES       OB History    Gravida  1   Para      Term      Preterm      AB      Living        SAB      IAB      Ectopic      Multiple      Live Births              History reviewed. No pertinent family history.  Social History   Tobacco Use  . Smoking status: Never Smoker  . Smokeless tobacco: Never Used  Vaping Use  . Vaping Use: Never used  Substance Use Topics  . Alcohol use: Yes  . Drug use: Not Currently    Frequency: 2.0 times per week     Types: Marijuana    Home Medications Prior to Admission medications   Medication Sig Start Date End Date Taking? Authorizing Provider  erythromycin ophthalmic ointment Place a 1/2 inch ribbon of ointment into the lower eyelid every 3-4 hours while awake for 7 days. 11/28/20   Arby Barrette, MD  GABAPENTIN PO Take by mouth.    [provider]  ibuprofen (ADVIL) 800 MG tablet Take 1 tablet (800 mg total) by mouth 3 (three) times daily. 11/28/20   Arby Barrette, MD  ibuprofen (ADVIL,MOTRIN) 600 MG tablet Take 1 tablet (600 mg total) by mouth every 6 (six) hours as needed (mild pain). 05/24/18   Conard Novak, MD  meloxicam (MOBIC) 15 MG tablet Take 1 tablet (15 mg total) by mouth daily. 03/23/20   Cuthriell, Delorise Royals, PA-C  methocarbamol (ROBAXIN) 500 MG tablet Take 1 tablet (500 mg total) by mouth 4 (four) times daily. 03/23/20   Cuthriell, Delorise Royals, PA-C  methylergonovine (METHERGINE) 0.2 MG tablet Take 1 tablet (0.2 mg total) by mouth 3 (three) times daily. Patient not taking: Reported on 08/20/2019 05/24/18  Conard Novak, MD    Allergies    Macrobid [nitrofurantoin macrocrystal] and Other  Review of Systems   Review of Systems  Constitutional: Negative for chills, diaphoresis, fatigue and fever.  HENT: Negative for congestion, sore throat and trouble swallowing.   Eyes: Negative for pain and visual disturbance.  Respiratory: Negative for cough, shortness of breath and wheezing.   Cardiovascular: Negative for chest pain, palpitations and leg swelling.  Gastrointestinal: Negative for abdominal distention, abdominal pain, diarrhea, nausea and vomiting.  Genitourinary: Negative for difficulty urinating.  Musculoskeletal: Positive for arthralgias and joint swelling. Negative for back pain, neck pain and neck stiffness.  Skin: Negative for pallor.  Neurological: Negative for dizziness, speech difficulty, weakness and headaches.  Psychiatric/Behavioral: Negative for  confusion.    Physical Exam Updated Vital Signs BP 139/90 (BP Location: Right Arm)   Pulse 92   Temp 98.6 F (37 C) (Oral)   Resp 16   SpO2 100%   Physical Exam Constitutional:      General: She is not in acute distress.    Appearance: Normal appearance. She is not ill-appearing, toxic-appearing or diaphoretic.  HENT:     Head: Normocephalic and atraumatic.  Cardiovascular:     Rate and Rhythm: Normal rate and regular rhythm.     Pulses: Normal pulses.  Pulmonary:     Effort: Pulmonary effort is normal.     Breath sounds: Normal breath sounds.  Musculoskeletal:        General: Normal range of motion.     Cervical back: Normal range of motion.       Legs:     Comments: Patient with swelling and tenderness to left lateral malleolus, no ecchymosis or erythema.  No warmth.  Patient is able to range ankle in all directions, full range of motion.  No tenderness to midfoot.  PT pulses 2+ and equal.  No tenderness to knee, able to move all toes with normal strength to all joints and lower extremity.  Skin:    General: Skin is warm and dry.     Capillary Refill: Capillary refill takes less than 2 seconds.  Neurological:     General: No focal deficit present.     Mental Status: She is alert and oriented to person, place, and time.  Psychiatric:        Mood and Affect: Mood normal.        Behavior: Behavior normal.        Thought Content: Thought content normal.     ED Results / Procedures / Treatments   Labs (all labs ordered are listed, but only abnormal results are displayed) Labs Reviewed - No data to display  EKG None  Radiology DG Ankle Complete Left  Result Date: 12/14/2020 CLINICAL DATA:  Fall.  Pain and swelling EXAM: LEFT ANKLE COMPLETE - 3+ VIEW COMPARISON:  None. FINDINGS: Negative for fracture.  Ankle joint space normal Lateral joint space narrowing.  Ankle joint effusion. IMPRESSION: Negative for fracture. Joint effusion with lateral soft tissue swelling.  Electronically Signed   By: Marlan Palau M.D.   On: 12/14/2020 15:48    Procedures Procedures   Medications Ordered in ED Medications - No data to display  ED Course  I have reviewed the triage vital signs and the nursing notes.  Pertinent labs & imaging results that were available during my care of the patient were reviewed by me and considered in my medical decision making (see chart for details).    MDM Rules/Calculators/A&P  Taylor Pierce is a 25 y.o. female with pertinent past medical history of anemia, Guillain-Barr, neuropathy that presents emerge department today for fall, left ankle injury.  Patient with plain films showing swelling, no acute fracture or dislocation.  Will place in brace and give crutches.  Patient is distally neurovascularly intact, most likely with ankle sprain.  Did not injure herself elsewhere, no other complaints.  Patient be discharged, symptomatic treatment discussed, patient will follow up with PCP/Ortho.  Doubt need for further emergent work up at this time. I explained the diagnosis and have given explicit precautions to return to the ER including for any other new or worsening symptoms. The patient understands and accepts the medical plan as it's been dictated and I have answered their questions. Discharge instructions concerning home care and prescriptions have been given. The patient is STABLE and is discharged to home in good condition.   Final Clinical Impression(s) / ED Diagnoses Final diagnoses:  Sprain of other ligament of left ankle, initial encounter    Rx / DC Orders ED Discharge Orders    None       Farrel Gordon, PA-C 12/14/20 1718    Arby Barrette, MD 12/23/20 1046

## 2021-07-03 ENCOUNTER — Emergency Department (HOSPITAL_COMMUNITY)
Admission: EM | Admit: 2021-07-03 | Discharge: 2021-07-04 | Disposition: A | Discharge status: Other Acute Inpatient Hospital | Payer: 59 | Attending: Emergency Medicine | Admitting: Emergency Medicine

## 2021-07-03 ENCOUNTER — Encounter (HOSPITAL_COMMUNITY): Payer: Self-pay | Admitting: Emergency Medicine

## 2021-07-03 ENCOUNTER — Other Ambulatory Visit: Payer: Self-pay

## 2021-07-03 DIAGNOSIS — F332 Major depressive disorder, recurrent severe without psychotic features: Secondary | ICD-10-CM | POA: Insufficient documentation

## 2021-07-03 DIAGNOSIS — Z20822 Contact with and (suspected) exposure to covid-19: Secondary | ICD-10-CM | POA: Insufficient documentation

## 2021-07-03 DIAGNOSIS — R45851 Suicidal ideations: Secondary | ICD-10-CM | POA: Diagnosis not present

## 2021-07-03 DIAGNOSIS — Z046 Encounter for general psychiatric examination, requested by authority: Secondary | ICD-10-CM | POA: Insufficient documentation

## 2021-07-03 DIAGNOSIS — F1020 Alcohol dependence, uncomplicated: Secondary | ICD-10-CM | POA: Insufficient documentation

## 2021-07-03 DIAGNOSIS — Y906 Blood alcohol level of 120-199 mg/100 ml: Secondary | ICD-10-CM | POA: Diagnosis not present

## 2021-07-03 LAB — CBC WITH DIFFERENTIAL/PLATELET
Abs Immature Granulocytes: 0.02 10*3/uL (ref 0.00–0.07)
Basophils Absolute: 0 10*3/uL (ref 0.0–0.1)
Basophils Relative: 1 %
Eosinophils Absolute: 0.2 10*3/uL (ref 0.0–0.5)
Eosinophils Relative: 3 %
HCT: 39 % (ref 36.0–46.0)
Hemoglobin: 12.7 g/dL (ref 12.0–15.0)
Immature Granulocytes: 0 %
Lymphocytes Relative: 35 %
Lymphs Abs: 2.6 10*3/uL (ref 0.7–4.0)
MCH: 29.2 pg (ref 26.0–34.0)
MCHC: 32.6 g/dL (ref 30.0–36.0)
MCV: 89.7 fL (ref 80.0–100.0)
Monocytes Absolute: 0.5 10*3/uL (ref 0.1–1.0)
Monocytes Relative: 7 %
Neutro Abs: 4 10*3/uL (ref 1.7–7.7)
Neutrophils Relative %: 54 %
Platelets: 356 10*3/uL (ref 150–400)
RBC: 4.35 MIL/uL (ref 3.87–5.11)
RDW: 13.1 % (ref 11.5–15.5)
WBC: 7.4 10*3/uL (ref 4.0–10.5)
nRBC: 0 % (ref 0.0–0.2)

## 2021-07-03 LAB — COMPREHENSIVE METABOLIC PANEL
ALT: 6 U/L (ref 0–44)
AST: 15 U/L (ref 15–41)
Albumin: 4.8 g/dL (ref 3.5–5.0)
Alkaline Phosphatase: 61 U/L (ref 38–126)
Anion gap: 13 (ref 5–15)
BUN: 8 mg/dL (ref 6–20)
CO2: 20 mmol/L — ABNORMAL LOW (ref 22–32)
Calcium: 9.4 mg/dL (ref 8.9–10.3)
Chloride: 103 mmol/L (ref 98–111)
Creatinine, Ser: 0.4 mg/dL — ABNORMAL LOW (ref 0.44–1.00)
GFR, Estimated: >60 mL/min (ref >60)
Glucose, Bld: 119 mg/dL — ABNORMAL HIGH (ref 70–99)
Potassium: 3.3 mmol/L — ABNORMAL LOW (ref 3.5–5.1)
Sodium: 136 mmol/L (ref 135–145)
Total Bilirubin: 0.5 mg/dL (ref 0.3–1.2)
Total Protein: 8.7 g/dL — ABNORMAL HIGH (ref 6.5–8.1)

## 2021-07-03 LAB — RAPID URINE DRUG SCREEN, HOSP PERFORMED
Amphetamines: NOT DETECTED
Barbiturates: NOT DETECTED
Benzodiazepines: NOT DETECTED
Cocaine: NOT DETECTED
Opiates: NOT DETECTED
Tetrahydrocannabinol: NOT DETECTED

## 2021-07-03 LAB — I-STAT BETA HCG BLOOD, ED (MC, WL, AP ONLY): I-stat hCG, quantitative: <5 m[IU]/mL (ref <5)

## 2021-07-03 LAB — ETHANOL: Alcohol, Ethyl (B): 183 mg/dL — ABNORMAL HIGH (ref <10)

## 2021-07-03 LAB — RESP PANEL BY RT-PCR (FLU A&B, COVID) ARPGX2
Influenza A by PCR: NEGATIVE
Influenza B by PCR: NEGATIVE
SARS Coronavirus 2 by RT PCR: NEGATIVE

## 2021-07-03 MED ORDER — ONDANSETRON HCL 4 MG PO TABS: 4.0000 mg | ORAL_TABLET | Freq: Three times a day (TID) | ORAL | Status: DC | PRN

## 2021-07-03 MED ORDER — ONDANSETRON HCL 4 MG/2ML IJ SOLN: 4.0000 mg | Freq: Once | INTRAMUSCULAR | Status: AC

## 2021-07-03 MED ORDER — SODIUM CHLORIDE 0.9 % IV BOLUS
1000.0000 mL | Freq: Once | INTRAVENOUS | Status: AC
  Administered 2021-07-03: 1000 mL via INTRAVENOUS

## 2021-07-03 MED ORDER — ACETAMINOPHEN 325 MG PO TABS: 650.0000 mg | ORAL_TABLET | ORAL | Status: DC | PRN

## 2021-07-03 MED ORDER — ONDANSETRON HCL 4 MG/2ML IJ SOLN
INTRAMUSCULAR | Status: AC
  Administered 2021-07-03: 4 mg via INTRAVENOUS
  Filled 2021-07-03: qty 2

## 2021-07-03 NOTE — Progress Notes (Signed)
Per Taylor Pierce, patient meets criteria for inpatient treatment. There are no available or appropriate beds at [facility] today. CSW faxed referrals to the following facilities for review:  [facility]  Pending - No Request Sent N/A [address], [place] [address] [phone] --  [facility]  Pending - No Request Sent N/A [address] [place] [address] [phone] --  [facility]  Pending - No Request Sent N/A [address] [place] [address] [phone] --  [facility]  Pending - No Request Sent N/A [address] [place] [address] [phone] --  [facility]  Pending - No Request Sent N/A [address] [place] [address] [phone] --  [facility]  Pending - No Request Sent N/A [address] [place] [address] [phone] --  [facility]  Pending - No Request Sent N/A [address] [place], [place] [address] [phone] --  [facility]  Pending - No Request Sent N/A [address] [place] [address] [phone] --  [facility]  Pending - No Request Sent N/A [address] [place] [address] [phone] --  [facility]  Pending - No Request Sent N/A [address] [place] [address] [phone] --  [facility]  Pending - No Request Sent N/A [address] [place] [address] [phone] --  [facility]  Pending - No Request Sent N/A [address], [place] [address] [phone] --  [facility]  Pending - No Request Sent N/A [address] [place] [address] [phone] --  [facility]  Pending - No Request Sent N/A [address] [place]e [place]s [place] [address] [phone] --  [facility]  Pending - No Request Sent N/A [address], [place] [address] [phone] --  [facility]  Pending - No Request Sent N/A [address], [place] [address] [phone] --  [facility]  Pending - No Request Sent N/A [address], [place] [address] [phone] --  [facility]  Pending - No Request Sent N/A [address], [place] [address] [phone] --  [facility]  Pending - No Request Sent N/A [address] [place] [address] [phone] --    TTS will continue to seek bed placement.  Crissie Reese, MSW, LCSW-A, LCAS-A Phone: [phone] Disposition/TOC

## 2021-07-03 NOTE — ED Notes (Signed)
No signs of distress. No complaints. VSS. Sitter at bedside.

## 2021-07-03 NOTE — ED Notes (Signed)
Resting quietly. No signs of distress. Calm and cooperative. Snack given.

## 2021-07-03 NOTE — Progress Notes (Signed)
CSW faxed lab result to [facility] as requested for further review.   Crissie Reese, MSW, LCSW-A, LCAS-A Phone: [phone] Disposition/TOC

## 2021-07-03 NOTE — Progress Notes (Signed)
Shay from [facility] contacted CSW in reference to acceptance information to the facility. It was reported the accepting information is pending. The patient needs a negative COVID result faxed to the facility.  Crissie Reese, MSW, LCSW-A, LCAS-A Phone: [phone] Disposition/TOC
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
## 2021-07-03 NOTE — Progress Notes (Signed)
[facility] admission contacted CSW to inquire if this patient still need place. Abran Cantor admission will review this patient with the provider for possible placement.   Crissie Reese, MSW, LCSW-A, LCAS-A Phone: [phone] Disposition/TOC

## 2021-07-03 NOTE — ED Provider Notes (Signed)
[facility]-EMERGENCY DEPT Provider Note   CSN: [id number] Arrival date & time: 07/03/21  0017     History Chief Complaint  Patient presents with   Suicidal    Taylor Pierce is a 25 y.o. female.  Patient presents to the emergency department for evaluation of suicidal ideation.  Patient is brought to the emergency department by police after initiating IVC.  Patient was apparently talking to a friend in [place] and told the friend that she was going to kill herself.  Police responded on a well check and she admitted that she was planning on committing suicide.  Patient has been drinking tonight.  She is intoxicated, tearful and distraught at arrival.  She admits to taking Benadryl tonight as an overdose.      Past Medical History:  Diagnosis Date   Anemia    Cerebellar atrophy (HCC)    [facility]) age 75   Neuropathy     Patient Active Problem List   Diagnosis Date Noted   Retained products of conception with hemorrhage 05/24/2018    Past Surgical History:  Procedure Laterality Date   DILATION AND EVACUATION N/A 05/24/2018   Procedure: DILATATION AND EVACUATION;  Surgeon: Conard Novak, MD;  Location: [facility] ORS;  Service: Gynecology;  Laterality: N/A;   NO PAST SURGERIES       OB History     Gravida  1   Para      Term      Preterm      AB      Living         SAB      IAB      Ectopic      Multiple      Live Births              No family history on file.  Social History   Tobacco Use   Smoking status: Never   Smokeless tobacco: Never  Vaping Use   Vaping Use: Never used  Substance Use Topics   Alcohol use: Yes   Drug use: Not Currently    Frequency: 2.0 times per week    Types: Marijuana    Home Medications Prior to Admission medications   Medication Sig Start Date End Date Taking? Authorizing Provider  DIPHENHYDRAMINE HCL PO PTA patient ingested unknown amount of benadryl   Yes [provider]  erythromycin ophthalmic ointment Place a 1/2 inch ribbon of ointment into the lower eyelid every 3-4 hours while awake for 7 days. Patient not taking: Reported on 07/03/2021 11/28/20   Arby Barrette, MD  ibuprofen (ADVIL) 800 MG tablet Take 1 tablet (800 mg total) by mouth 3 (three) times daily. Patient not taking: Reported on 07/03/2021 11/28/20   Arby Barrette, MD  ibuprofen (ADVIL,MOTRIN) 600 MG tablet Take 1 tablet (600 mg total) by mouth every 6 (six) hours as needed (mild pain). Patient not taking: Reported on 07/03/2021 05/24/18   Conard Novak, MD  meloxicam (MOBIC) 15 MG tablet Take 1 tablet (15 mg total) by mouth daily. Patient not taking: Reported on 07/03/2021 03/23/20   Cuthriell, Delorise Royals, PA-C  methocarbamol (ROBAXIN) 500 MG tablet Take 1 tablet (500 mg total) by mouth 4 (four) times daily. Patient not taking: Reported on 07/03/2021 03/23/20   Cuthriell, Delorise Royals, PA-C  methylergonovine (METHERGINE) 0.2 MG tablet Take 1 tablet (0.2 mg total) by mouth 3 (three) times daily. Patient not taking: No sig reported 05/24/18   Jean Rosenthal,  Mila Homer, MD    Allergies    Macrobid [nitrofurantoin macrocrystal] and Other  Review of Systems   Review of Systems  Psychiatric/Behavioral:  Positive for dysphoric mood and suicidal ideas.   All other systems reviewed and are negative.  Physical Exam Updated Vital Signs BP 99/71   Pulse 85   Temp 98.8 F (37.1 C)   Resp 18   SpO2 93%   Physical Exam Vitals and nursing note reviewed.  Constitutional:      Appearance: She is well-developed.  HENT:     Head: Normocephalic and atraumatic.     Right Ear: Hearing normal.     Left Ear: Hearing normal.     Nose: Nose normal.  Eyes:     Conjunctiva/sclera: Conjunctivae normal.     Pupils: Pupils are equal, round, and reactive to light.  Cardiovascular:     Rate and Rhythm: Regular rhythm.     Heart sounds: S1 normal and S2 normal. No murmur heard.   No friction  rub. No gallop.  Pulmonary:     Effort: Pulmonary effort is normal. No respiratory distress.     Breath sounds: Normal breath sounds.  Chest:     Chest wall: No tenderness.  Abdominal:     General: Bowel sounds are normal.     Palpations: Abdomen is soft.     Tenderness: There is no abdominal tenderness. There is no guarding or rebound. Negative signs include Murphy's sign and McBurney's sign.     Hernia: No hernia is present.  Musculoskeletal:        General: Normal range of motion.     Cervical back: Normal range of motion and neck supple.  Skin:    General: Skin is warm and dry.     Findings: No rash.  Neurological:     Mental Status: She is alert and oriented to person, place, and time.     GCS: GCS eye subscore is 4. GCS verbal subscore is 5. GCS motor subscore is 6.     Cranial Nerves: No cranial nerve deficit.     Sensory: No sensory deficit.     Coordination: Coordination normal.  Psychiatric:        Mood and Affect: Mood is depressed. Affect is tearful.        Speech: Speech normal.        Behavior: Behavior is withdrawn.        Thought Content: Thought content includes suicidal ideation.    ED Results / Procedures / Treatments   Labs (all labs ordered are listed, but only abnormal results are displayed) Labs Reviewed  COMPREHENSIVE METABOLIC PANEL - Abnormal; Notable for the following components:      Result Value   Potassium 3.3 (*)    CO2 20 (*)    Glucose, Bld 119 (*)    Creatinine, Ser 0.40 (*)    Total Protein 8.7 (*)    All other components within normal limits  ETHANOL - Abnormal; Notable for the following components:   Alcohol, Ethyl (B) 183 (*)    All other components within normal limits  CBC WITH DIFFERENTIAL/PLATELET  RAPID URINE DRUG SCREEN, HOSP PERFORMED  I-STAT BETA HCG BLOOD, ED ([facility], [facility], [facility] ONLY)    EKG None ED ECG REPORT   Date: 07/03/2021  Rate: 124  Rhythm: sinus tachycardia  QRS Axis: normal  Intervals: normal  ST/T Wave  abnormalities: normal  Conduction Disutrbances:none  Narrative Interpretation:   Old EKG Reviewed: none available  I have personally reviewed the EKG tracing and agree with the computerized printout as noted.  Radiology No results found.  Procedures Procedures   Medications Ordered in ED Medications  ondansetron (ZOFRAN) injection 4 mg (4 mg Intravenous Given 07/03/21 0036)  sodium chloride 0.9 % bolus 1,000 mL (0 mLs Intravenous Stopped 07/03/21 0317)    ED Course  I have reviewed the triage vital signs and the nursing notes.  Pertinent labs & imaging results that were available during my care of the patient were reviewed by me and considered in my medical decision making (see chart for details).    MDM Rules/Calculators/A&P                           Patient brought to the emergency department for evaluation of suicidal ideation.  She is under IVC, initiated by police department.  Patient told her friend that she was going to kill herself.  She also admits to taking Benadryl overdose.  Patient intoxicated at arrival.  She has been monitored for an appropriate amount of time and vital signs are stable.  She is awake and alert.  At this point she is medically clear for psychiatric evaluation.  Final Clinical Impression(s) / ED Diagnoses Final diagnoses:  Suicidal ideation    Rx / DC Orders ED Discharge Orders     None        Teresita Fanton, Canary Brim, MD 07/03/21 0530

## 2021-07-03 NOTE — ED Triage Notes (Signed)
Patient arrives via EMS. Patient states she took an unknown amount of benadryl (possibly 10), with an unknown amount of wine and rum. Patient states and increase in life stressors.

## 2021-07-03 NOTE — ED Notes (Signed)
[employer] called for transportation. Voicemail left.

## 2021-07-03 NOTE — Progress Notes (Signed)
Per Danella Deis, pt has been accepted to [facility] room 821. Accepting provider is Dr. Leane Call. Patient can arrive anytime. Number for report is [phone].    Crissie Reese, MSW, LCSW-A Phone: [phone] Disposition/TOC

## 2021-07-03 NOTE — ED Notes (Signed)
Meal given. Tolerated well. No signs of distress.

## 2021-07-03 NOTE — Progress Notes (Signed)
CSW faxed  COVID result to [facility] for review.  Crissie Reese, MSW, LCSW-A, LCAS-A Phone: [phone] Disposition/TOC

## 2021-07-03 NOTE — ED Notes (Addendum)
Report given to Rennis Chris RN at [facility]) [phone].   for transfer to room 821. Accepting provider is Dr. Leane Call.

## 2021-07-03 NOTE — ED Notes (Signed)
TTS in progress 

## 2021-07-04 NOTE — Discharge Instructions (Signed)
Transfer to [facility].

## 2021-07-04 NOTE — ED Notes (Signed)
Pt taken to TCU for shower, has been calm and cooperative all morning.

## 2021-07-04 NOTE — ED Notes (Signed)
Breakfast tray given. °

## 2021-07-04 NOTE — ED Provider Notes (Signed)
Emergency Medicine Observation Re-evaluation Note  Taylor Pierce is a 25 y.o. female, seen on rounds today.  Pt initially presented to the ED for complaints of Suicidal Currently, the patient is calm, nad. Pt accepted to [facility] for inpatient psych tx, Dr Dewaine Conger.   Physical Exam  BP (!) 139/98 (BP Location: Left Arm)   Pulse 79   Temp 98.1 F (36.7 C) (Oral)   Resp 18   SpO2 99%  Physical Exam General: calm alert Cardiac: regular rate Lungs: breathing comfortably Psych: calm, alert.   ED Course / MDM   I have reviewed the labs performed to date as well as medications administered while in observation.  Recent changes in the last 24 hours include ED obs and BH reassessment.   Plan  Patient accepted to [facility], Dr Dewaine Conger, bed and transport ready.  Pt currently appears stable for transfer.     Cathren Laine, MD 07/04/21 [id number]

## 2021-12-28 ENCOUNTER — Emergency Department (HOSPITAL_COMMUNITY): Payer: BC Managed Care – PPO

## 2021-12-28 ENCOUNTER — Other Ambulatory Visit: Payer: Self-pay

## 2021-12-28 ENCOUNTER — Inpatient Hospital Stay (HOSPITAL_COMMUNITY)
Admission: EM | Admit: 2021-12-28 | Discharge: 2021-12-30 | DRG: 690 | Disposition: A | Discharge status: Home / Self Care | Payer: BC Managed Care – PPO | Attending: Internal Medicine | Admitting: Internal Medicine

## 2021-12-28 DIAGNOSIS — M549 Dorsalgia, unspecified: Secondary | ICD-10-CM | POA: Diagnosis not present

## 2021-12-28 DIAGNOSIS — N12 Tubulo-interstitial nephritis, not specified as acute or chronic: Secondary | ICD-10-CM | POA: Diagnosis not present

## 2021-12-28 DIAGNOSIS — Z87440 Personal history of urinary (tract) infections: Secondary | ICD-10-CM

## 2021-12-28 DIAGNOSIS — F319 Bipolar disorder, unspecified: Secondary | ICD-10-CM | POA: Diagnosis present

## 2021-12-28 DIAGNOSIS — G319 Degenerative disease of nervous system, unspecified: Secondary | ICD-10-CM | POA: Diagnosis present

## 2021-12-28 DIAGNOSIS — Z881 Allergy status to other antibiotic agents status: Secondary | ICD-10-CM

## 2021-12-28 DIAGNOSIS — D649 Anemia, unspecified: Secondary | ICD-10-CM | POA: Diagnosis present

## 2021-12-28 DIAGNOSIS — G894 Chronic pain syndrome: Secondary | ICD-10-CM | POA: Diagnosis present

## 2021-12-28 DIAGNOSIS — G610 Guillain-Barre syndrome: Secondary | ICD-10-CM | POA: Diagnosis present

## 2021-12-28 DIAGNOSIS — Z888 Allergy status to other drugs, medicaments and biological substances status: Secondary | ICD-10-CM

## 2021-12-28 LAB — CBC WITH DIFFERENTIAL/PLATELET
Abs Immature Granulocytes: 0.02 10*3/uL (ref 0.00–0.07)
Basophils Absolute: 0 10*3/uL (ref 0.0–0.1)
Basophils Relative: 0 %
Eosinophils Absolute: 0.2 10*3/uL (ref 0.0–0.5)
Eosinophils Relative: 3 %
HCT: 37.8 % (ref 36.0–46.0)
Hemoglobin: 11.8 g/dL — ABNORMAL LOW (ref 12.0–15.0)
Immature Granulocytes: 0 %
Lymphocytes Relative: 32 %
Lymphs Abs: 1.9 10*3/uL (ref 0.7–4.0)
MCH: 28.4 pg (ref 26.0–34.0)
MCHC: 31.2 g/dL (ref 30.0–36.0)
MCV: 90.9 fL (ref 80.0–100.0)
Monocytes Absolute: 0.5 10*3/uL (ref 0.1–1.0)
Monocytes Relative: 8 %
Neutro Abs: 3.4 10*3/uL (ref 1.7–7.7)
Neutrophils Relative %: 57 %
Platelets: 294 10*3/uL (ref 150–400)
RBC: 4.16 MIL/uL (ref 3.87–5.11)
RDW: 13.5 % (ref 11.5–15.5)
WBC: 6.1 10*3/uL (ref 4.0–10.5)
nRBC: 0 % (ref 0.0–0.2)

## 2021-12-28 LAB — URINALYSIS, ROUTINE W REFLEX MICROSCOPIC
Bilirubin Urine: NEGATIVE
Glucose, UA: NEGATIVE mg/dL
Hgb urine dipstick: NEGATIVE
Ketones, ur: NEGATIVE mg/dL
Nitrite: NEGATIVE
Protein, ur: NEGATIVE mg/dL
Specific Gravity, Urine: 1.02 (ref 1.005–1.030)
WBC, UA: >50 WBC/hpf — ABNORMAL HIGH (ref 0–5)
pH: 6 (ref 5.0–8.0)

## 2021-12-28 LAB — COMPREHENSIVE METABOLIC PANEL
ALT: 5 U/L (ref 0–44)
AST: 16 U/L (ref 15–41)
Albumin: 4.4 g/dL (ref 3.5–5.0)
Alkaline Phosphatase: 57 U/L (ref 38–126)
Anion gap: 8 (ref 5–15)
BUN: 9 mg/dL (ref 6–20)
CO2: 26 mmol/L (ref 22–32)
Calcium: 10 mg/dL (ref 8.9–10.3)
Chloride: 104 mmol/L (ref 98–111)
Creatinine, Ser: 0.72 mg/dL (ref 0.44–1.00)
GFR, Estimated: >60 mL/min (ref >60)
Glucose, Bld: 99 mg/dL (ref 70–99)
Potassium: 3.8 mmol/L (ref 3.5–5.1)
Sodium: 138 mmol/L (ref 135–145)
Total Bilirubin: 0.4 mg/dL (ref 0.3–1.2)
Total Protein: 7.6 g/dL (ref 6.5–8.1)

## 2021-12-28 MED ORDER — MORPHINE SULFATE (PF) 4 MG/ML IV SOLN
4.0000 mg | Freq: Once | INTRAVENOUS | Status: AC
  Administered 2021-12-28: 4 mg via INTRAVENOUS
  Filled 2021-12-28: qty 1

## 2021-12-28 MED ORDER — LORAZEPAM 2 MG/ML IJ SOLN
1.0000 mg | Freq: Once | INTRAMUSCULAR | Status: AC
Start: 2021-12-28 — End: 2021-12-28
  Administered 2021-12-28: 1 mg via INTRAVENOUS
  Filled 2021-12-28: qty 1

## 2021-12-28 NOTE — ED Provider Triage Note (Signed)
Emergency Medicine Provider Triage Evaluation Note ? ?Taylor Pierce , a 26 y.o. female  was evaluated in triage.  Pt complains of increasing lower back and lower leg pain.  Pain worse on the right leg.  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  States this feels similar but different, and that previous episode included weakness and this involves pain.  Has noticed 2 incidences of urinary incontinence, both happened while she was sleeping.  Pre-existing deficiency of proprioception and touch sensation of the lower legs, but is still able to feel temperature, pressure, and pain.  Denies recent illness, fever, or trauma.  Hx of bipolar disorder type II.  Endorses mild dysuria. ? ?Review of Systems  ?Positive: As above ?Negative: As above ? ?Physical Exam  ?BP (!) 150/108 (BP Location: Right Arm)   Pulse 80   Temp 98.5 ?F (36.9 ?C) (Oral)   Resp 16   SpO2 100%  ?Gen:   Awake, no distress   ?Resp:  Normal effort  ?MSK:   Moves extremities without little difficulty ?Other:  Absent DTR of patellar and Achilles bilaterally.  Subjectively unable to detect sensation of lower extremities other than warmth and pressure.  Full range of motion of lower extremities.  DP 2+ bilaterally.  Upper extremities appear unaffected.  Coordination appears grossly intact. ? ?Medical Decision Making  ?Medically screening exam initiated at 7:17 PM.  Appropriate orders placed.  Taylor Pierce was informed that the remainder of the evaluation will be completed by another provider, this initial triage assessment does not replace that evaluation, and the importance of remaining in the ED until their evaluation is complete. ? ?Labs ordered ? ?Discussed with nursing staff that she is higher priority and will get the next available room ?  ?Cecil Cobbs, PA-C ?12/28/21 1941 ? ?

## 2021-12-28 NOTE — ED Triage Notes (Signed)
Pt with hx GBS here for eval of lower back pain since end of last week which has now progressed to pain to legs. Denies weakness in legs, but does endorse new urinary incontinence. Has had similar episode before and was paralyzed for three weeks. Denies injury.  ?

## 2021-12-28 NOTE — ED Provider Notes (Signed)
?[facility] EMERGENCY DEPARTMENT ?Provider Note ? ? ?CSN: [id number] ?Arrival date & time: 12/28/21  1708 ? ?  ? ?History ? ?Chief Complaint  ?Patient presents with  ? Back Pain  ? Urinary Incontinence  ? ? ?Taylor Pierce is a 26 y.o. female. ? ?Patient with history of bipolar, GBS at age 58, weakness and numbness in 2012 with MRI brain and spine showing atrophy of cerebellum and posterior columns followed by [facility] neurology for same who presents today for back pain that radiates down her bilateral legs. States that same began 2 days ago and has been progressively worsening since. States that she had 2 episodes of urinary incontinence yesterday which she described as 'mild dribbling' but none since.  Denies any history of urinary incontinence in the past.  Denies any saddle paresthesias or loss of bowel function.  She states that since the onset of her pain that she has been walking with a walker for support.  She denies any fevers, chills, history of malignancy, history of long-term steroid use, history of IVDU. Does endorse some dysuria without fevers, chills, flank pain, hematuria, or vaginal discharge. ? ? ?The history is provided by the patient. No language interpreter was used.  ?Back Pain ?Associated symptoms: dysuria   ? ?  ? ?Home Medications ?Prior to Admission medications   ?Medication Sig Start Date End Date Taking? Authorizing Provider  ?ibuprofen (ADVIL) 200 MG tablet Take 800 mg by mouth every 6 (six) hours as needed (FOR CHRONIC PAIN).   Yes [provider]  ?erythromycin ophthalmic ointment Place a 1/2 inch ribbon of ointment into the lower eyelid every 3-4 hours while awake for 7 days. ?Patient not taking: Reported on 12/28/2021 11/28/20   Arby Barrette, MD  ?ibuprofen (ADVIL) 800 MG tablet Take 1 tablet (800 mg total) by mouth 3 (three) times daily. ?Patient not taking: Reported on 12/28/2021 11/28/20   Arby Barrette, MD  ?ibuprofen (ADVIL,MOTRIN) 600 MG tablet Take 1 tablet  (600 mg total) by mouth every 6 (six) hours as needed (mild pain). ?Patient not taking: Reported on 12/28/2021 05/24/18   Conard Novak, MD  ?meloxicam (MOBIC) 15 MG tablet Take 1 tablet (15 mg total) by mouth daily. ?Patient not taking: Reported on 12/28/2021 03/23/20   Cuthriell, Delorise Royals, PA-C  ?methocarbamol (ROBAXIN) 500 MG tablet Take 1 tablet (500 mg total) by mouth 4 (four) times daily. ?Patient not taking: Reported on 12/28/2021 03/23/20   Cuthriell, Delorise Royals, PA-C  ?methylergonovine (METHERGINE) 0.2 MG tablet Take 1 tablet (0.2 mg total) by mouth 3 (three) times daily. ?Patient not taking: Reported on 12/28/2021 05/24/18   Conard Novak, MD  ?   ? ?Allergies    ?Nitrofurantoin, Other, and Macrobid [nitrofurantoin macrocrystal]   ? ?Review of Systems   ?Review of Systems  ?Genitourinary:  Positive for dysuria.  ?Musculoskeletal:  Positive for back pain and gait problem. Negative for neck pain and neck stiffness.  ?Skin:  Negative for wound.  ?All other systems reviewed and are negative. ? ?Physical Exam ?Updated Vital Signs ?BP 120/66   Pulse 84   Temp 98.5 ?F (36.9 ?C) (Oral)   Resp 16   SpO2 99%  ?Physical Exam ?Vitals and nursing note reviewed.  ?Constitutional:   ?   General: She is not in acute distress. ?   Appearance: Normal appearance. She is normal weight. She is not ill-appearing, toxic-appearing or diaphoretic.  ?HENT:  ?   Head: Normocephalic and atraumatic.  ?Cardiovascular:  ?  Rate and Rhythm: Normal rate.  ?Pulmonary:  ?   Effort: Pulmonary effort is normal. No respiratory distress.  ?Musculoskeletal:     ?   General: Normal range of motion.  ?   Cervical back: Normal range of motion.  ?Skin: ?   General: Skin is warm and dry.  ?Neurological:  ?   General: No focal deficit present.  ?   Mental Status: She is alert and oriented to person, place, and time.  ?   GCS: GCS eye subscore is 4. GCS verbal subscore is 5. GCS motor subscore is 6.  ?   Sensory: Sensation is intact.  ?    Motor: Motor function is intact.  ?   Coordination: Coordination is intact.  ?   Gait: Gait is intact.  ?   Comments: Alert and oriented to self, place, time and event.  ?  ?Speech is fluent, clear without dysarthria or dysphasia.  ?  ?Strength 5/5 in upper extremities, 3/5 in bilateral lower extremities  ?Sensation intact in upper/lower extremities  ? ?No tenderness to palpation of cervical, thoracic, or lumbar spine ?  ?CN I not tested  ?CN II grossly intact visual fields bilaterally. Did not visualize posterior eye.  ?CN III, IV, VI PERRLA and EOMs intact bilaterally  ?CN V Intact sensation to sharp and light touch to the face  ?CN VII facial movements symmetric  ?CN VIII not tested  ?CN IX, X no uvula deviation, symmetric rise of soft palate  ?CN XI 5/5 SCM and trapezius strength bilaterally  ?CN XII Midline tongue protrusion, symmetric L/R movements   ?Psychiatric:     ?   Mood and Affect: Mood normal.     ?   Behavior: Behavior normal.  ? ? ?ED Results / Procedures / Treatments   ?Labs ?(all labs ordered are listed, but only abnormal results are displayed) ?Labs Reviewed  ?CBC WITH DIFFERENTIAL/PLATELET - Abnormal; Notable for the following components:  ?    Result Value  ? Hemoglobin 11.8 (*)   ? All other components within normal limits  ?URINALYSIS, ROUTINE W REFLEX MICROSCOPIC - Abnormal; Notable for the following components:  ? APPearance HAZY (*)   ? Leukocytes,Ua MODERATE (*)   ? WBC, UA >50 (*)   ? Bacteria, UA FEW (*)   ? All other components within normal limits  ?COMPREHENSIVE METABOLIC PANEL  ? ? ?EKG ?None ? ?Radiology ?No results found. ? ?Procedures ?Procedures  ? ? ?Medications Ordered in ED ?Medications  ?LORazepam (ATIVAN) injection 1 mg (1 mg Intravenous Given 12/28/21 2241)  ?morphine (PF) 4 MG/ML injection 4 mg (4 mg Intravenous Given 12/28/21 2242)  ? ? ?ED Course/ Medical Decision Making/ A&P ?  ?                        ?Medical Decision Making ?Amount and/or Complexity of Data  Reviewed ?Labs: ordered. ?Radiology: ordered. ?ECG/medicine tests: ordered. ? ?Risk ?Prescription drug management. ?Decision regarding hospitalization. ? ? ?This patient presents to the ED for concern of back pain, this involves an extensive number of treatment options, and is a complaint that carries with it a high risk of complications and morbidity. ? ? ?Co morbidities that complicate the patient evaluation ? 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 ? ?Additional history obtained: ? ?Additional history obtained from previous neurology notes and [facility] ? ? ?Lab Tests: ? ?I Ordered, and personally interpreted labs.  The pertinent results include:  UA infectious, no other acute laboratory findings ? ? ?Imaging Studies ordered: ? ?I ordered imaging studies including MRI c spine and t spine with and without contrast pending at shift change ? ? ?Consultations Obtained: ? ?I requested consultation with the Dr. Amada Jupiter from neurology,  and discussed lab and imaging findings as well as pertinent plan - they recommend: After discussion, neurology does not feel that LP is warranted at this time, some concern for transverse myelitis triggered by stress from patients UTI, recommends MRI c spine and t spine with and without contrast for further evaluation of same ? ? ?Problem List / ED Course / Critical interventions / Medication management ? ?I ordered medication including ativan and morphine  for pain  ?Reevaluation of the patient after these medicines showed that the patient improved ? ? ?Patient with back pain and significant history of brain and spinal cord issues in the past. MRIs pending at shift change. If no acute findings, suspect patient will be discharged with antibiotics for UTI and close neurology follow-up. Will need re-evaluation following results. ? ?Care handoff to Dr. Preston Fleeting at shift change. Please see their note for further evaluation and management. ? ?Findings and  plan of care discussed with supervising physician Dr. Karene Fry who is in agreement.  ? ? ?Final Clinical Impression(s) / ED Diagnoses ?Final diagnoses:  ?None  ? ? ?Rx / DC Orders ?ED Discharge Orders   ? ? None  ? ?  ?

## 2021-12-29 ENCOUNTER — Emergency Department (HOSPITAL_COMMUNITY): Payer: BC Managed Care – PPO

## 2021-12-29 ENCOUNTER — Encounter (HOSPITAL_COMMUNITY): Payer: Self-pay | Admitting: Internal Medicine

## 2021-12-29 DIAGNOSIS — N12 Tubulo-interstitial nephritis, not specified as acute or chronic: Principal | ICD-10-CM | POA: Diagnosis present

## 2021-12-29 DIAGNOSIS — G894 Chronic pain syndrome: Secondary | ICD-10-CM | POA: Diagnosis present

## 2021-12-29 DIAGNOSIS — G610 Guillain-Barre syndrome: Secondary | ICD-10-CM | POA: Diagnosis present

## 2021-12-29 DIAGNOSIS — G319 Degenerative disease of nervous system, unspecified: Secondary | ICD-10-CM | POA: Diagnosis present

## 2021-12-29 DIAGNOSIS — D649 Anemia, unspecified: Secondary | ICD-10-CM

## 2021-12-29 DIAGNOSIS — Z881 Allergy status to other antibiotic agents status: Secondary | ICD-10-CM | POA: Diagnosis not present

## 2021-12-29 DIAGNOSIS — M549 Dorsalgia, unspecified: Secondary | ICD-10-CM | POA: Diagnosis present

## 2021-12-29 DIAGNOSIS — Z87440 Personal history of urinary (tract) infections: Secondary | ICD-10-CM | POA: Diagnosis not present

## 2021-12-29 DIAGNOSIS — Z888 Allergy status to other drugs, medicaments and biological substances status: Secondary | ICD-10-CM | POA: Diagnosis not present

## 2021-12-29 DIAGNOSIS — F319 Bipolar disorder, unspecified: Secondary | ICD-10-CM | POA: Diagnosis present

## 2021-12-29 LAB — LACTIC ACID, PLASMA
Lactic Acid, Venous: 1.2 mmol/L (ref 0.5–1.9)
Lactic Acid, Venous: 0.9 mmol/L (ref 0.5–1.9)

## 2021-12-29 LAB — BASIC METABOLIC PANEL
Anion gap: 7 (ref 5–15)
BUN: 6 mg/dL (ref 6–20)
CO2: 24 mmol/L (ref 22–32)
Calcium: 8.8 mg/dL — ABNORMAL LOW (ref 8.9–10.3)
Chloride: 106 mmol/L (ref 98–111)
Creatinine, Ser: 0.57 mg/dL (ref 0.44–1.00)
GFR, Estimated: >60 mL/min (ref >60)
Glucose, Bld: 94 mg/dL (ref 70–99)
Potassium: 3.6 mmol/L (ref 3.5–5.1)
Sodium: 137 mmol/L (ref 135–145)

## 2021-12-29 LAB — CBC WITH DIFFERENTIAL/PLATELET
Abs Immature Granulocytes: 0.02 10*3/uL (ref 0.00–0.07)
Basophils Absolute: 0 10*3/uL (ref 0.0–0.1)
Basophils Relative: 1 %
Eosinophils Absolute: 0.2 10*3/uL (ref 0.0–0.5)
Eosinophils Relative: 4 %
HCT: 31.3 % — ABNORMAL LOW (ref 36.0–46.0)
Hemoglobin: 10 g/dL — ABNORMAL LOW (ref 12.0–15.0)
Immature Granulocytes: 0 %
Lymphocytes Relative: 42 %
Lymphs Abs: 2.2 10*3/uL (ref 0.7–4.0)
MCH: 29.1 pg (ref 26.0–34.0)
MCHC: 31.9 g/dL (ref 30.0–36.0)
MCV: 91 fL (ref 80.0–100.0)
Monocytes Absolute: 0.5 10*3/uL (ref 0.1–1.0)
Monocytes Relative: 10 %
Neutro Abs: 2.2 10*3/uL (ref 1.7–7.7)
Neutrophils Relative %: 43 %
Platelets: 207 10*3/uL (ref 150–400)
RBC: 3.44 MIL/uL — ABNORMAL LOW (ref 3.87–5.11)
RDW: 13.6 % (ref 11.5–15.5)
WBC: 5.1 10*3/uL (ref 4.0–10.5)
nRBC: 0 % (ref 0.0–0.2)

## 2021-12-29 LAB — I-STAT BETA HCG BLOOD, ED (MC, WL, AP ONLY): I-stat hCG, quantitative: <5 m[IU]/mL (ref <5)

## 2021-12-29 LAB — HIV ANTIBODY (ROUTINE TESTING W REFLEX): HIV Screen 4th Generation wRfx: NONREACTIVE

## 2021-12-29 LAB — APTT: aPTT: 29 seconds (ref 24–36)

## 2021-12-29 LAB — PROTIME-INR
INR: 1.1 (ref 0.8–1.2)
Prothrombin Time: 13.9 seconds (ref 11.4–15.2)

## 2021-12-29 MED ORDER — ACETAMINOPHEN 650 MG RE SUPP
650.0000 mg | Freq: Four times a day (QID) | RECTAL | Status: DC | PRN
Start: 2021-12-29 — End: 2021-12-30

## 2021-12-29 MED ORDER — GADOBUTROL 1 MMOL/ML IV SOLN
5.5000 mL | Freq: Once | INTRAVENOUS | Status: AC | PRN
  Administered 2021-12-29: 5.5 mL via INTRAVENOUS

## 2021-12-29 MED ORDER — CARIPRAZINE HCL 1.5 MG PO CAPS
1.5000 mg | ORAL_CAPSULE | Freq: Every day | ORAL | Status: DC
Start: 2021-12-29 — End: 2021-12-30
  Filled 2021-12-29 (×2): qty 1

## 2021-12-29 MED ORDER — SODIUM CHLORIDE 0.9 % IV SOLN: INTRAVENOUS | Status: DC

## 2021-12-29 MED ORDER — KETOROLAC TROMETHAMINE 15 MG/ML IJ SOLN
15.0000 mg | Freq: Four times a day (QID) | INTRAMUSCULAR | Status: DC | PRN
  Administered 2021-12-29 (×2): 15 mg via INTRAVENOUS
  Filled 2021-12-29 (×2): qty 1

## 2021-12-29 MED ORDER — MORPHINE SULFATE (PF) 4 MG/ML IV SOLN
4.0000 mg | Freq: Once | INTRAVENOUS | Status: AC
  Administered 2021-12-29: 4 mg via INTRAVENOUS
  Filled 2021-12-29: qty 1

## 2021-12-29 MED ORDER — ONDANSETRON HCL 4 MG PO TABS: 4.0000 mg | ORAL_TABLET | Freq: Four times a day (QID) | ORAL | Status: DC | PRN

## 2021-12-29 MED ORDER — LACTATED RINGERS IV BOLUS (SEPSIS)
2000.0000 mL | Freq: Once | INTRAVENOUS | Status: AC
  Administered 2021-12-29: 2000 mL via INTRAVENOUS

## 2021-12-29 MED ORDER — GABAPENTIN 100 MG PO CAPS
100.0000 mg | ORAL_CAPSULE | Freq: Two times a day (BID) | ORAL | Status: DC
  Administered 2021-12-29: 100 mg via ORAL
  Filled 2021-12-29 (×2): qty 1

## 2021-12-29 MED ORDER — ACETAMINOPHEN 325 MG PO TABS: 650.0000 mg | ORAL_TABLET | Freq: Four times a day (QID) | ORAL | Status: DC | PRN

## 2021-12-29 MED ORDER — CEFTRIAXONE SODIUM 1 G IJ SOLR: 1.0000 g | INTRAMUSCULAR | Status: DC

## 2021-12-29 MED ORDER — SODIUM CHLORIDE 0.9 % IV SOLN
2.0000 g | Freq: Once | INTRAVENOUS | Status: AC
  Administered 2021-12-29: 2 g via INTRAVENOUS
  Filled 2021-12-29: qty 20

## 2021-12-29 MED ORDER — PANTOPRAZOLE SODIUM 40 MG PO TBEC
40.0000 mg | DELAYED_RELEASE_TABLET | Freq: Every day | ORAL | Status: DC
  Administered 2021-12-29 – 2021-12-30 (×2): 40 mg via ORAL
  Filled 2021-12-29 (×2): qty 1

## 2021-12-29 MED ORDER — ONDANSETRON HCL 4 MG/2ML IJ SOLN: 4.0000 mg | Freq: Four times a day (QID) | INTRAMUSCULAR | Status: DC | PRN

## 2021-12-29 MED ORDER — SODIUM CHLORIDE 0.9 % IV SOLN
2.0000 g | INTRAVENOUS | Status: DC
  Administered 2021-12-29: 2 g via INTRAVENOUS
  Filled 2021-12-29: qty 20

## 2021-12-29 MED ORDER — LAMOTRIGINE 25 MG PO TABS
25.0000 mg | ORAL_TABLET | Freq: Two times a day (BID) | ORAL | Status: DC
  Filled 2021-12-29 (×3): qty 1

## 2021-12-29 MED ORDER — ENOXAPARIN SODIUM 40 MG/0.4ML IJ SOSY
40.0000 mg | PREFILLED_SYRINGE | INTRAMUSCULAR | Status: DC
  Administered 2021-12-29 – 2021-12-30 (×2): 40 mg via SUBCUTANEOUS
  Filled 2021-12-29 (×2): qty 0.4

## 2021-12-29 MED ORDER — OXYCODONE HCL 5 MG PO TABS
5.0000 mg | ORAL_TABLET | ORAL | Status: DC | PRN
  Administered 2021-12-29 – 2021-12-30 (×3): 5 mg via ORAL
  Filled 2021-12-29 (×3): qty 1

## 2021-12-29 NOTE — ED Provider Notes (Signed)
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  She is pending MRI of thoracic and lumbar spine.  If negative, will need to go home on antibiotics with neurology follow-up. ? ?MRI scans do not show any acute process.  I have independently reviewed the images, and agree with the radiologist's interpretation.  However, on return from MRI, patient has become hypotensive with blood pressures running approximately 90/55.  In the setting of known urinary tract infection, this is concerning for sepsis.  She is started on the evolving sepsis pathway and will check lactic acid level.  In the meantime, she is given early, goal-directed fluids and IV antibiotics.   ? ?Following IV fluids, blood pressure has improved.  Case is discussed with Dr. Toniann Fail of [facility] hospital[facility], who agrees to admit the patient. ? ?CRITICAL CARE ?Performed by: Dione Booze ?Total critical care time: 45 minutes ?Critical care time was exclusive of separately billable procedures and treating other patients. ?Critical care was necessary to treat or prevent imminent or life-threatening deterioration. ?Critical care was time spent personally by me on the following activities: development of treatment plan with patient and/or surrogate as well as nursing, discussions with consultants, evaluation of patient's response to treatment, examination of patient, obtaining history from patient or surrogate, ordering and performing treatments and interventions, ordering and review of laboratory studies, ordering and review of radiographic studies, pulse oximetry and re-evaluation of patient's condition. ?  ?Dione Booze, MD ?12/29/21 0805 ? ?

## 2021-12-29 NOTE — ED Notes (Signed)
I have sent messages  no response ?

## 2021-12-29 NOTE — H&P (Signed)
?History and Physical  ? ? ?Patient: Taylor Pierce ZOX:[id number] DOB: [birth year] ?DOA: 12/28/2021 ?DOS: the patient was seen and examined on 12/29/2021 ?PCP: [facility]  ?Patient coming from: Home ? ?Chief Complaint:  ?Chief Complaint  ?Patient presents with  ? Back Pain  ? Urinary Incontinence  ? ?HPI: Taylor Pierce is a 26 y.o. 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  Patient is followed by [facility] neurology as well as her PCP is Dr. Joni Reining [place].  Patient reports history of recurrent UTI and review of Care Everywhere patient had a pansensitive E. coli UTI in 2021.  2 days prior to presentation she developed back pain and lower leg pain that seem to be worse over the past several days.  Severe enough discomfort she was having trouble walking.  She reports a chronic history of urinary dribbling and sometimes overfull bladder related to her underlying neurological condition and forgetfulness to maintain to a bladder regimen.  No fevers at home no other change in her baseline neurological symptoms and no neurological symptoms ? ?In the emergency department she was afebrile and her vital signs were stable with initial SBP readings in the 120s.  She was mildly anxious and was given IV Ativan as well as IV morphine.  Subsequently thereafter SBP dropped into the 90s and EDP concerned that she may be developing sepsis physiology so sepsis orders were initiated and she was given routine fluid challenges and started on fluid resuscitation.  SBP recovered.  Lactic acid x2 negative.  Urinalysis appears consistent with UTI.  Urine culture and blood cultures are pending.  She was given a dose of  empiric Rocephin.  Initially plans were to discharge her home prior to the episode of apparent sepsis physiology.  Because of the lower extremity pain and issues an MRI of the T-spine was obtained and did not reveal any acute abnormalities. ? ? ?Review of Systems: As mentioned in the history of present illness. All other systems reviewed and are negative. ?Past Medical History:  ?Diagnosis Date  ? Anemia   ? Cerebellar atrophy (HCC)   ? Guillain-Barre [facility]) age 89  ? Neuropathy   ? ?Past Surgical History:  ?Procedure Laterality Date  ? DILATION AND EVACUATION N/A 05/24/2018  ? Procedure: DILATATION AND EVACUATION;  Surgeon: Conard Novak, MD;  Location: [facility] ORS;  Service: Gynecology;  Laterality: N/A;  ? NO PAST SURGERIES    ? ?Social History:  reports that she has never smoked. She has never used smokeless tobacco. She reports current alcohol use. She reports that she does not currently use drugs after having used the following drugs: Marijuana. Frequency: 2.00 times per week. ? ?Allergies  ?Allergen Reactions  ? Nitrofurantoin Itching and Rash  ? Other Other (See Comments)  ?  IV pain medication, cannot remember name of drug- reaction type not recalled   ? Macrobid [Nitrofurantoin Macrocrystal] Itching and Rash  ? ? ?No family history on file. ? ?Prior to Admission medications   ?Medication Sig Start Date End Date Taking? Authorizing Provider  ?ibuprofen (ADVIL) 200 MG tablet Take 800 mg by mouth every 6 (six) hours as needed (FOR CHRONIC PAIN).   Yes [provider]  ?erythromycin ophthalmic ointment Place a 1/2 inch ribbon  of ointment into the lower eyelid every 3-4 hours while awake for 7 days. ?Patient not taking: Reported on 12/28/2021 11/28/20   Arby BarrettePfeiffer, Marcy, MD  ?ibuprofen (ADVIL) 800 MG tablet Take 1 tablet (800 mg total) by mouth 3 (three) times daily. ?Patient not taking: Reported on 12/28/2021 11/28/20   Arby BarrettePfeiffer, Marcy, MD  ?ibuprofen (ADVIL,MOTRIN) 600 MG tablet Take 1 tablet (600 mg  total) by mouth every 6 (six) hours as needed (mild pain). ?Patient not taking: Reported on 12/28/2021 05/24/18   Conard NovakJackson, Stephen D, MD  ?meloxicam (MOBIC) 15 MG tablet Take 1 tablet (15 mg total) by mouth daily. ?Patient not taking: Reported on 12/28/2021 03/23/20   Cuthriell, Delorise RoyalsJonathan D, PA-C  ?methocarbamol (ROBAXIN) 500 MG tablet Take 1 tablet (500 mg total) by mouth 4 (four) times daily. ?Patient not taking: Reported on 12/28/2021 03/23/20   Cuthriell, Delorise RoyalsJonathan D, PA-C  ?methylergonovine (METHERGINE) 0.2 MG tablet Take 1 tablet (0.2 mg total) by mouth 3 (three) times daily. ?Patient not taking: Reported on 12/28/2021 05/24/18   Conard NovakJackson, Stephen D, MD  ? ? ?Physical Exam: ?Vitals:  ? 12/29/21 0545 12/29/21 0615 12/29/21 0630 12/29/21 0645  ?BP: 108/74 102/66 120/88 120/79  ?Pulse: 69 70 85 93  ?Resp: 14 16 14 20   ?Temp:      ?TempSrc:      ?SpO2: 99% 99% 100% 100%  ? ?Constitutional: NAD, calm, comfortable ?Eyes: PERRL, lids and conjunctivae normal ?ENMT: Mucous membranes are moist. Posterior pharynx clear of any exudate or lesions.Normal dentition.  ?Neck: normal, supple, no masses, no thyromegaly ?Respiratory: clear to auscultation bilaterally, no wheezing, no crackles. Normal respiratory effort. No accessory muscle use.  ?Cardiovascular: Regular rate and rhythm, no murmurs / rubs / gallops. No extremity edema. 2+ pedal pulses. No carotid bruits.  ?Abdomen: no tenderness, no masses palpated. No hepatosplenomegaly. Bowel sounds positive.  ?Genitourinary: Positive suprapubic tenderness.  Positive CVAT on right ?Musculoskeletal: no clubbing / cyanosis. No joint deformity upper and lower extremities. Good ROM, no contractures. Normal muscle tone.  ?Skin: no rashes, lesions, ulcers. No induration ?Neurologic: CN 2-12 grossly intact. Sensation intact except lower extremities, unable to differentiate between sharp and dull and dull sensation she is not actually able to perceive absent patellar DTRs. Strength 5/5 x all 4  extremities.  ?Psychiatric: Normal judgment and insight. Alert and oriented x 3. Normal mood.  ? ? ?Data Reviewed: ? ?Urinalysis consistent with likely UTI, urine culture and blood cultures pending , mild anemia with a hemoglobin of 10 and when reviewed against labs found in care everywhere this is close to baseline of around 11. ? ?Assessment and Plan: ?No notes have been filed under this hospital service. ?Service: Hospitalist ? ?UTI with suspected evolving pyelonephritis ?Follow-up on urine culture and blood culture ?Currently hemodynamically stable and no signs of sepsis with sepsis subsequently ruled out ?Empiric IV Rocephin ?Tylenol for mild pain and fever ?IV Toradol for more significant pain and fever not responsive to Tylenol ? ?Chronic anemia ?Follow after hydration ?Hemoglobin 10 with baseline around 10-11 ?[facility] ? ?Chronic pain syndrome ?Patient typically on ibuprofen daily-Toradol IV as needed ?Continue gabapentin as needed ? ?Bipolar disorder ?Continue Lamictal and Vraylar ? ?History of alcohol abuse  ?not currently using ? ? ? Advance Care Planning:   Code Status: Full Code  ? ?Consults: None ? ?Family Communication: Patient only ? ?DVT prophylaxis Lovenox ? ?Severity of Illness: ?The appropriate patient status for this patient is INPATIENT. Inpatient status is  judged to be reasonable and necessary in order to provide the required intensity of service to ensure the patient's safety. The patient's presenting symptoms, physical exam findings, and initial radiographic and laboratory data in the context of their chronic comorbidities is felt to place them at high risk for further clinical deterioration. Furthermore, it is not anticipated that the patient will be medically stable for discharge from the hospital within 2 midnights of admission.  ? ?* I certify that at the point of admission it is my clinical judgment that the patient will require inpatient hospital care  spanning beyond 2 midnights from the point of admission due to high intensity of service, high risk for further deterioration and high frequency of surveillance required.* ? ?Author: ?Junious Silk, NP ?4/19/202

## 2021-12-29 NOTE — ED Notes (Signed)
Med given for back pain ?

## 2021-12-29 NOTE — ED Notes (Signed)
Roxanne Mins MD notified of soft BP 90's/50's (down from 120's). New orders- see MAR. ?

## 2021-12-30 DIAGNOSIS — N12 Tubulo-interstitial nephritis, not specified as acute or chronic: Principal | ICD-10-CM

## 2021-12-30 LAB — BASIC METABOLIC PANEL
Anion gap: 4 — ABNORMAL LOW (ref 5–15)
BUN: 9 mg/dL (ref 6–20)
CO2: 25 mmol/L (ref 22–32)
Calcium: 8.4 mg/dL — ABNORMAL LOW (ref 8.9–10.3)
Chloride: 108 mmol/L (ref 98–111)
Creatinine, Ser: 0.82 mg/dL (ref 0.44–1.00)
GFR, Estimated: >60 mL/min (ref >60)
Glucose, Bld: 93 mg/dL (ref 70–99)
Potassium: 3.8 mmol/L (ref 3.5–5.1)
Sodium: 137 mmol/L (ref 135–145)

## 2021-12-30 LAB — URINE CULTURE

## 2021-12-30 LAB — CBC
HCT: 31.3 % — ABNORMAL LOW (ref 36.0–46.0)
Hemoglobin: 10.1 g/dL — ABNORMAL LOW (ref 12.0–15.0)
MCH: 29.1 pg (ref 26.0–34.0)
MCHC: 32.3 g/dL (ref 30.0–36.0)
MCV: 90.2 fL (ref 80.0–100.0)
Platelets: 214 10*3/uL (ref 150–400)
RBC: 3.47 MIL/uL — ABNORMAL LOW (ref 3.87–5.11)
RDW: 13.5 % (ref 11.5–15.5)
WBC: 4.6 10*3/uL (ref 4.0–10.5)
nRBC: 0 % (ref 0.0–0.2)

## 2021-12-30 MED ORDER — CEFDINIR 300 MG PO CAPS: 300.0000 mg | ORAL_CAPSULE | Freq: Two times a day (BID) | ORAL | 0 refills | Status: AC

## 2021-12-30 NOTE — Discharge Summary (Signed)
Physician Discharge Summary  ?Taylor Pierce [id number] DOB: 1996-07-11 DOA: 12/28/2021 ? ?PCP: [facility] ? ?Admit date: 12/28/2021 ?Discharge date: 12/30/2021 ? ?Admitted From: Home ?Disposition:  Home ? ?Discharge Condition:Stable ?CODE STATUS:FULL ?Diet recommendation: Regular  ? ?Brief/Interim Summary: ?Taylor Pierce is a 26 y.o. 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  She has history of recurrent UTI.  On presentation she was afebrile, hemodynamically stable.  Urinalysis was positive of UTI.  She was started on ceftriaxone for the suspicion of UTI/pyelonephritis.  Because of persistent back pain, cervical and thoracic MRI were done without finding of any acute changes.  She remains hemodynamically stable for discharge home with oral antibiotics. ? ?Following problems were addressed during her hospitalization: ? ? ?UTI with suspected evolving pyelonephritis ?Sent urine culture and blood culture, no growth has been noticed ?Currently hemodynamically stable and no signs of sepsis with sepsis subsequently ruled out ?Started on IV Rocephin, changed to [place] today ?Back pain has resolved ?  ?Chronic anemia ?Follow after hydration ?Hemoglobin 10 with baseline around 10-11 ?Followed by PCP at [facility] ?  ?Chronic pain syndrome ?Patient typically on ibuprofen daily-Toradol IV as needed ?Continue gabapentin as needed ?Presented with back pain.  Cervical, T-spine did not show any acute findings ?  ?Bipolar disorder ?Continue Lamictal and Vraylar ?  ? ? ? ?Discharge Diagnoses:  ?Principal Problem: ?  Pyelonephritis ? ? ? ?Discharge Instructions ? ?Discharge Instructions   ? ? Diet general   Complete by: As directed ?  ? Discharge instructions   Complete by: As directed ?  ? 1)Please take  prescribed medications as instructed  ? Increase activity slowly   Complete by: As directed ?  ? ?  ? ?Allergies as of 12/30/2021   ? ?   Reactions  ? Nitrofurantoin Itching, Rash  ? Other Other (See Comments)  ? IV pain medication, cannot remember name of drug- reaction type not recalled   ? Macrobid [nitrofurantoin Macrocrystal] Itching, Rash  ? ?  ? ?  ?Medication List  ?  ? ?TAKE these medications   ? ?cefdinir 300 MG capsule ?Commonly known as: OMNICEF ?Take 1 capsule (300 mg total) by mouth 2 (two) times daily for 4 days. ?  ?erythromycin ophthalmic ointment ?Place a 1/2 inch ribbon of ointment into the lower eyelid every 3-4 hours while awake for 7 days. ?  ?ibuprofen 200 MG tablet ?Commonly known as: ADVIL ?Take 800 mg by mouth every 6 (six) hours as needed (FOR CHRONIC PAIN). ?What changed: Another medication with the same name was removed. Continue taking this medication, and follow the directions you see here. ?  ?meloxicam 15 MG tablet ?Commonly known as: MOBIC ?Take 1 tablet (15 mg total) by mouth daily. ?  ?methocarbamol 500 MG tablet ?Commonly known as: Robaxin ?Take 1 tablet (500 mg total) by mouth 4 (four) times daily. ?  ?methylergonovine 0.2 MG tablet ?Commonly known as: METHERGINE ?Take 1 tablet (0.2 mg total) by mouth 3 (three) times daily. ?  ? ?  ? ? Follow-up Information   ? ? [facility] [place]. Schedule an appointment as soon as possible for a visit in 1 week(s).   ?Contact information: ?[address] ? ?[address] ?[place] [id number] ?[phone] ? ? ?  ?  ? ?  ?  ? ?  ? ?  Allergies  ?Allergen Reactions  ? Nitrofurantoin Itching and Rash  ? Other Other (See Comments)  ?  IV pain medication, cannot remember name of drug- reaction type not recalled   ? Macrobid [Nitrofurantoin Macrocrystal] Itching and Rash  ? ? ?Consultations: ?None ? ? ?Procedures/Studies: ?MR Cervical Spine W or Wo Contrast ? ?Result Date: 12/29/2021 ?CLINICAL DATA:  Acute myelopathy EXAM: MRI CERVICAL  AND THORACIC SPINE WITHOUT AND WITH CONTRAST TECHNIQUE: Multiplanar and multiecho pulse sequences of the cervical spine, to include the craniocervical junction and cervicothoracic junction, and the thoracic spine, were obtained without and with intravenous contrast. CONTRAST:  5.84mL GADAVIST GADOBUTROL 1 MMOL/ML IV SOLN COMPARISON:  None. FINDINGS: MRI CERVICAL SPINE FINDINGS Alignment: Reversal of normal cervical lordosis. Vertebrae: No fracture, evidence of discitis, or bone lesion. Cord: Normal signal and morphology. Posterior Fossa, vertebral arteries, paraspinal tissues: Negative. Disc levels: No spinal canal or neural foraminal stenosis. MRI THORACIC SPINE FINDINGS Alignment:  S shaped scoliosis. Vertebrae: No fracture, evidence of discitis, or bone lesion. Cord:  Normal signal and morphology. Paraspinal and other soft tissues: Negative. Disc levels: No spinal canal or neural foraminal stenosis. IMPRESSION: No cervical or thoracic spinal cord abnormality. Electronically Signed   By: Ulyses Jarred M.D.   On: 12/29/2021 01:11  ? ?MR THORACIC SPINE W WO CONTRAST ? ?Result Date: 12/29/2021 ?CLINICAL DATA:  Acute myelopathy EXAM: MRI CERVICAL AND THORACIC SPINE WITHOUT AND WITH CONTRAST TECHNIQUE: Multiplanar and multiecho pulse sequences of the cervical spine, to include the craniocervical junction and cervicothoracic junction, and the thoracic spine, were obtained without and with intravenous contrast. CONTRAST:  5.66mL GADAVIST GADOBUTROL 1 MMOL/ML IV SOLN COMPARISON:  None. FINDINGS: MRI CERVICAL SPINE FINDINGS Alignment: Reversal of normal cervical lordosis. Vertebrae: No fracture, evidence of discitis, or bone lesion. Cord: Normal signal and morphology. Posterior Fossa, vertebral arteries, paraspinal tissues: Negative. Disc levels: No spinal canal or neural foraminal stenosis. MRI THORACIC SPINE FINDINGS Alignment:  S shaped scoliosis. Vertebrae: No fracture, evidence of discitis, or bone lesion. Cord:  Normal  signal and morphology. Paraspinal and other soft tissues: Negative. Disc levels: No spinal canal or neural foraminal stenosis. IMPRESSION: No cervical or thoracic spinal cord abnormality. Electronically Signed   By: Ulyses Jarred M.D.   On: 12/29/2021 01:11  ? ?DG Chest Port 1 View ? ?Result Date: 12/29/2021 ?CLINICAL DATA:  Sepsis EXAM: PORTABLE CHEST 1 VIEW COMPARISON:  None. FINDINGS: The heart size and mediastinal contours are within normal limits. Both lungs are clear. The visualized skeletal structures are unremarkable. IMPRESSION: No active disease. Electronically Signed   By: Ulyses Jarred M.D.   On: 12/29/2021 02:12   ? ? ? ?Subjective: ?Patient seen and examined at the bedside this morning.  Hemodynamically stable.  Comfortable, stable for discharge ? ?Discharge Exam: ?Vitals:  ? 12/30/21 0552 12/30/21 0800  ?BP: 111/76 120/76  ?Pulse: 79 84  ?Resp: 19 16  ?Temp: 98.2 ?F (36.8 ?C) 98.2 ?F (36.8 ?C)  ?SpO2: 100% 100%  ? ?Vitals:  ? 12/29/21 2021 12/30/21 0345 12/30/21 0552 12/30/21 0800  ?BP: 112/85 (!) 106/97 111/76 120/76  ?Pulse: 73 72 79 84  ?Resp: 17 20 19 16   ?Temp: 98.1 ?F (36.7 ?C) 98.1 ?F (36.7 ?C) 98.2 ?F (36.8 ?C) 98.2 ?F (36.8 ?C)  ?TempSrc: Oral Oral Oral Oral  ?SpO2: 100% 100% 100% 100%  ?Weight:      ?Height:      ? ? ?General: Pt is alert, awake, not in acute distress ?Cardiovascular: RRR, S1/S2 +,  no rubs, no gallops ?Respiratory: CTA bilaterally, no wheezing, no rhonchi ?Abdominal: Soft, NT, ND, bowel sounds + ?Extremities: no edema, no cyanosis ? ? ? ?The results of significant diagnostics from this hospitalization (including imaging, microbiology, ancillary and laboratory) are listed below for reference.   ? ? ?Microbiology: ?Recent Results (from the past 240 hour(s))  ?Blood Culture (routine x 2)     Status: None (Preliminary result)  ? Collection Time: 12/29/21  2:10 AM  ? Specimen: BLOOD LEFT HAND  ?Result Value Ref Range Status  ? Specimen Description BLOOD LEFT HAND  Final  ? Special  Requests   Final  ?  BOTTLES DRAWN AEROBIC AND ANAEROBIC Blood Culture results may not be optimal due to an inadequate volume of blood received in culture bottles  ? Culture   Final  ?  NO GROWTH 1 DAY ?Perfo

## 2022-01-03 LAB — CULTURE, BLOOD (ROUTINE X 2)
Culture: NO GROWTH
Culture: NO GROWTH

## 2023-07-02 ENCOUNTER — Ambulatory Visit (HOSPITAL_COMMUNITY)
Admission: EM | Admit: 2023-07-02 | Discharge: 2023-07-02 | Disposition: A | Discharge status: Home / Self Care | Payer: Medicaid Other

## 2023-07-02 ENCOUNTER — Encounter (HOSPITAL_COMMUNITY): Payer: Self-pay

## 2023-07-02 DIAGNOSIS — H1031 Unspecified acute conjunctivitis, right eye: Secondary | ICD-10-CM

## 2023-07-02 DIAGNOSIS — Z973 Presence of spectacles and contact lenses: Secondary | ICD-10-CM

## 2023-07-02 MED ORDER — CIPROFLOXACIN HCL 0.3 % OP SOLN
1.0000 [drp] | OPHTHALMIC | 0 refills | Status: AC
Start: 2023-07-02 — End: ?

## 2023-07-02 NOTE — ED Triage Notes (Signed)
Pt with c/o redness, itching, burning and clear drainage to right eye that started 2 days ago. Denies vision changes.

## 2023-07-02 NOTE — Discharge Instructions (Addendum)
Use the cipro eye drops as prescribed Keep washing your hands I recommend to wash your pillowcase and sheets Please follow up with eye specialist if needed

## 2023-07-02 NOTE — ED Provider Notes (Signed)
[facility]    CSN: [id number] Arrival date & time: 07/02/23  1332      History   Chief Complaint Chief Complaint  Patient presents with   Conjunctivitis    HPI Taylor Pierce is a 27 y.o. female.  2 day history of right eye redness, discomfort Green mucous discharge from the eye this morning No vision changes Not having pain with eye movement. 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 Authorizing Provider  ciprofloxacin (CILOXAN) 0.3 % ophthalmic solution Place 1 drop into the right eye as directed. Administer 1 drop, every 2 hours, while awake, for 2 days. Then 1 drop, every 4 hours, while awake, for the next 5 days. 07/02/23  Yes Rheana Casebolt, Lurena Joiner, PA-C  lamoTRIgine (LAMICTAL) 25 MG tablet Take 25 mg by mouth 2 (two) times daily.    [provider]  VRAYLAR 1.5 MG capsule Take 1.5 mg by mouth daily.    [provider]    Family History History reviewed. No pertinent family history.  Social History Social History   Tobacco Use   Smoking status: Never   Smokeless tobacco: Never  Vaping  Use   Vaping status: Never Used  Substance Use Topics   Alcohol use: Yes   Drug use: Not Currently    Frequency: 2.0 times per week    Types: Marijuana     Allergies   Nitrofurantoin, Other, and Macrobid [nitrofurantoin macrocrystal]   Review of Systems Review of Systems Per HPI  Physical Exam Triage Vital Signs ED Triage Vitals  Encounter Vitals Group     BP      Systolic BP Percentile      Diastolic BP Percentile      Pulse      Resp      Temp      Temp src      SpO2      Weight      Height      Head Circumference      Peak Flow      Pain Score      Pain Loc      Pain Education      Exclude from Growth Chart    No data found.  Updated Vital Signs  BP 134/74 (BP Location: Right Arm)   Pulse 78   Temp 98.7 F (37.1 C) (Oral)   Resp 16   SpO2 98%   Physical Exam Vitals and nursing note reviewed.  Constitutional:      General: She is not in acute distress.    Appearance: She is not ill-appearing.  Eyes:     General: Vision grossly intact.        Right eye: Discharge present.     Extraocular Movements: Extraocular movements intact.     Conjunctiva/sclera:     Right eye: Right conjunctiva is injected.     Comments: Right conjunctiva is injected, there is discharge in the lashes. A little swelling of upper eyelid. No pain with EOM. Vision intact  Cardiovascular:     Rate and Rhythm: Normal rate and regular rhythm.  Pulmonary:     Effort: Pulmonary effort is normal.  Musculoskeletal:     Cervical back: Normal range of motion.  Skin:    General: Skin is warm and dry.  Neurological:     Mental Status: She is alert and oriented to person, place, and time.     UC Treatments / Results  Labs (all labs ordered are listed, but only abnormal results are displayed) Labs Reviewed - No data to display  EKG  Radiology No results found.  Procedures Procedures (including critical care time)  Medications Ordered in UC Medications - No data to  display  Initial Impression / Assessment and Plan / UC Course  I have reviewed the triage vital signs and the nursing notes.  Pertinent labs & imaging results that were available during my care of the patient were reviewed by me and considered in my medical decision making (see chart for details).  Bacterial conjunctivitis, right eye Patient is contact lens wearer, cover with cipro eye drops q2 hours x 2 days, q4 hours x 5 days  Has eye specialist she can follow up with if needed Otherwise return here  Final Clinical Impressions(s) / UC Diagnoses   Final diagnoses:  Acute bacterial conjunctivitis of right eye  Wears contact lenses     Discharge Instructions      Use the cipro eye drops as prescribed Keep washing your hands I recommend to wash your pillowcase and sheets Please follow up with eye specialist if needed     ED Prescriptions     Medication Sig Dispense Auth. Provider   ciprofloxacin (CILOXAN) 0.3 % ophthalmic solution Place 1 drop into the right eye as directed. Administer 1 drop, every 2 hours, while awake, for 2 days. Then 1 drop, every 4 hours, while awake, for the next 5 days. 5 mL Raneem Mendolia, Lurena Joiner, PA-C      PDMP not reviewed this encounter.   Nigel Wessman, Lurena Joiner, [place] 07/02/23 1505

## 2023-08-02 IMAGING — DX DG CHEST 1V PORT
1 series · 1 of 1 positions shown · non-contrast
Comparison: None.

CLINICAL DATA: Sepsis

EXAM:
PORTABLE CHEST 1 VIEW

[chest]
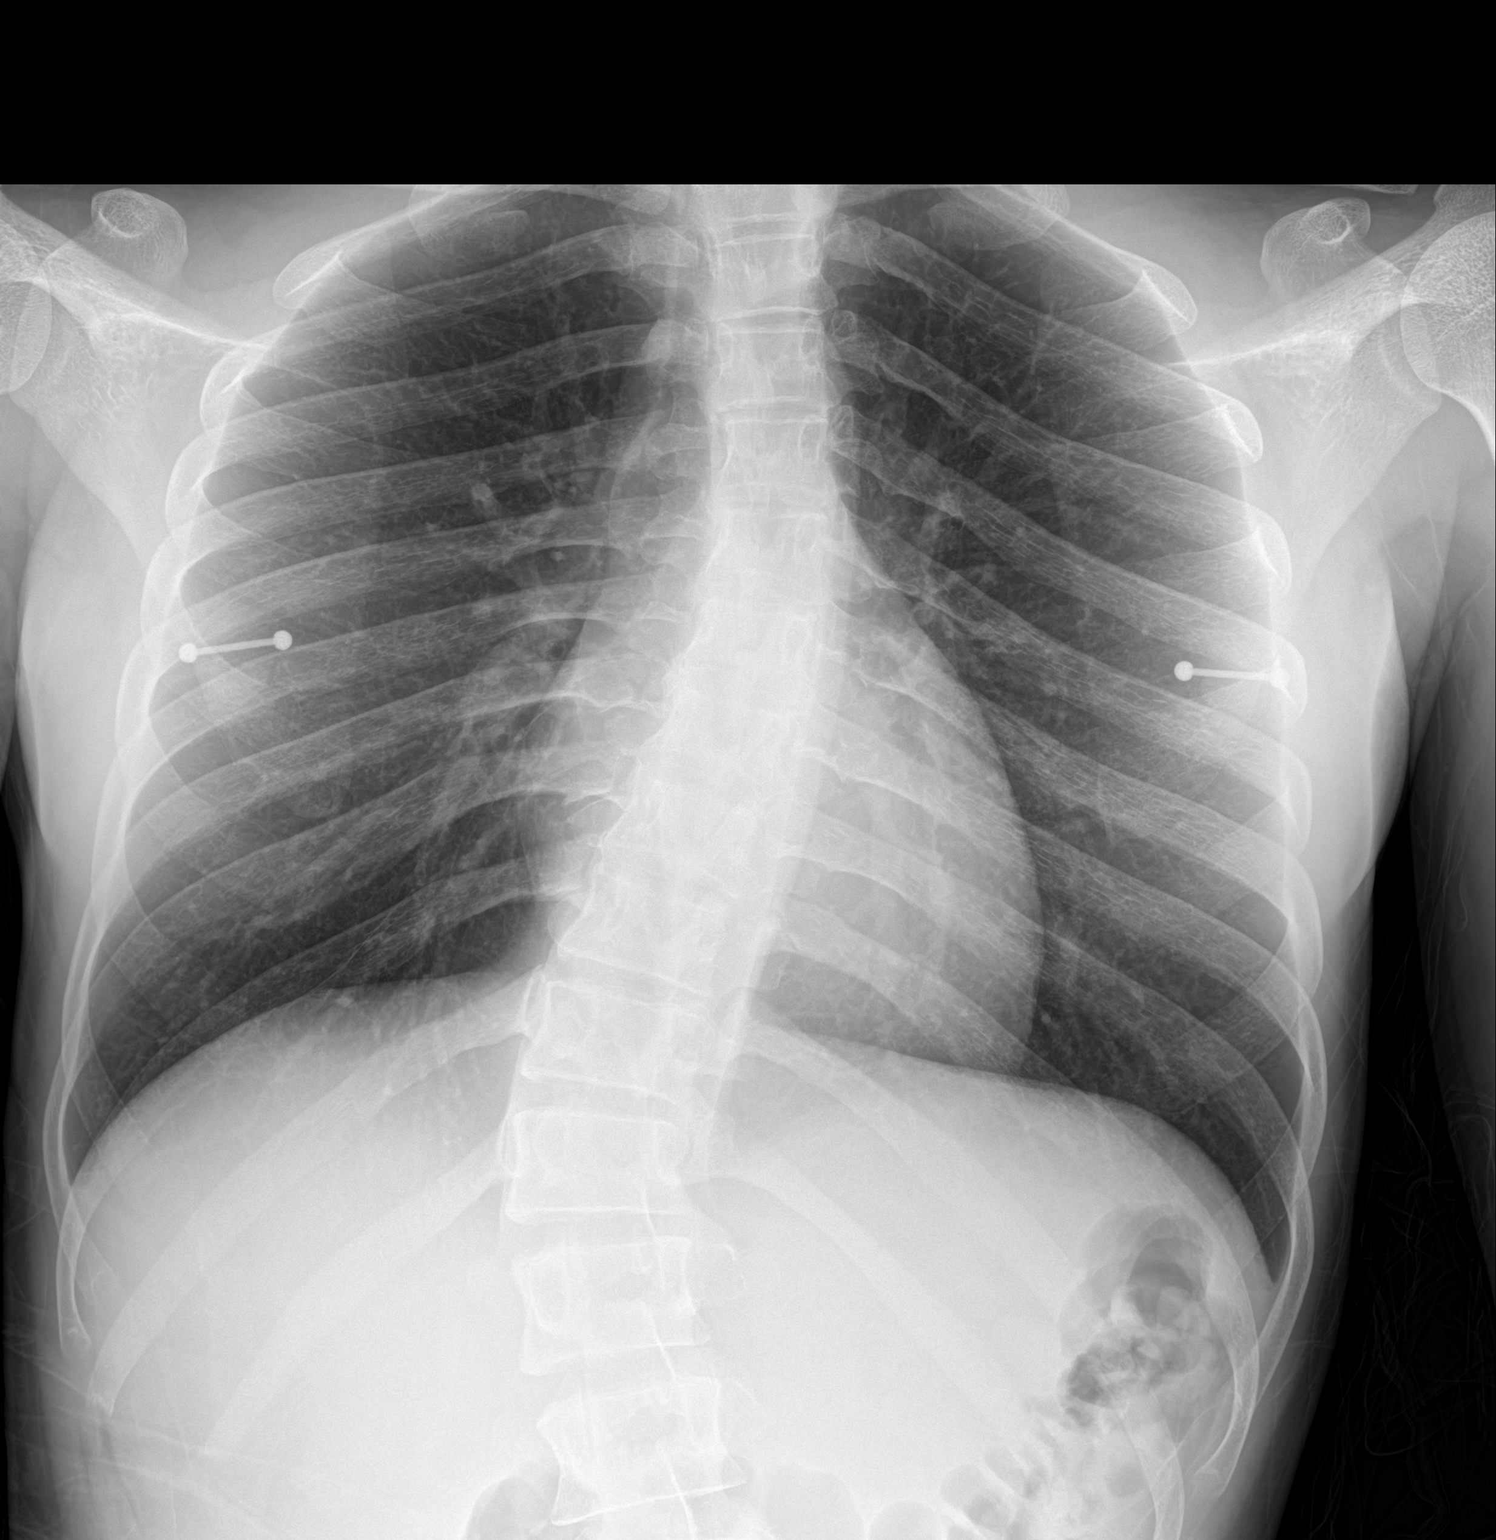

[1 of 1 positions shown; findings below may reference images not displayed]

FINDINGS: The heart size and mediastinal contours are within normal limits.
Both lungs are clear. The visualized skeletal structures are
unremarkable.
IMPRESSION: No active disease.

## 2024-04-18 ENCOUNTER — Emergency Department (HOSPITAL_COMMUNITY)

## 2024-04-18 ENCOUNTER — Encounter (HOSPITAL_COMMUNITY): Payer: Self-pay

## 2024-04-18 ENCOUNTER — Emergency Department (HOSPITAL_COMMUNITY)
Admission: EM | Admit: 2024-04-18 | Discharge: 2024-04-18 | Disposition: A | Discharge status: Home / Self Care | Attending: Emergency Medicine | Admitting: Emergency Medicine

## 2024-04-18 ENCOUNTER — Other Ambulatory Visit: Payer: Self-pay

## 2024-04-18 DIAGNOSIS — M546 Pain in thoracic spine: Secondary | ICD-10-CM | POA: Diagnosis not present

## 2024-04-18 DIAGNOSIS — S29012A Strain of muscle and tendon of back wall of thorax, initial encounter: Secondary | ICD-10-CM

## 2024-04-18 DIAGNOSIS — R0789 Other chest pain: Secondary | ICD-10-CM | POA: Diagnosis present

## 2024-04-18 LAB — CBC
HCT: 38.4 % (ref 36.0–46.0)
Hemoglobin: 12.4 g/dL (ref 12.0–15.0)
MCH: 29.5 pg (ref 26.0–34.0)
MCHC: 32.3 g/dL (ref 30.0–36.0)
MCV: 91.4 fL (ref 80.0–100.0)
Platelets: 291 K/uL (ref 150–400)
RBC: 4.2 MIL/uL (ref 3.87–5.11)
RDW: 13.4 % (ref 11.5–15.5)
WBC: 6.7 K/uL (ref 4.0–10.5)
nRBC: 0 % (ref 0.0–0.2)

## 2024-04-18 LAB — BASIC METABOLIC PANEL WITH GFR
Anion gap: 9 (ref 5–15)
BUN: 10 mg/dL (ref 6–20)
CO2: 23 mmol/L (ref 22–32)
Calcium: 9.2 mg/dL (ref 8.9–10.3)
Chloride: 102 mmol/L (ref 98–111)
Creatinine, Ser: 0.72 mg/dL (ref 0.44–1.00)
GFR, Estimated: >60 mL/min (ref >60)
Glucose, Bld: 89 mg/dL (ref 70–99)
Potassium: 3.8 mmol/L (ref 3.5–5.1)
Sodium: 134 mmol/L — ABNORMAL LOW (ref 135–145)

## 2024-04-18 LAB — TROPONIN I (HIGH SENSITIVITY)
Troponin I (High Sensitivity): <2 ng/L (ref <18)
Troponin I (High Sensitivity): 2 ng/L (ref <18)

## 2024-04-18 LAB — HCG, SERUM, QUALITATIVE: Preg, Serum: NEGATIVE

## 2024-04-18 LAB — D-DIMER, QUANTITATIVE: D-Dimer, Quant: <0.27 ug{FEU}/mL (ref 0.00–0.50)

## 2024-04-18 MED ORDER — ACETAMINOPHEN 500 MG PO TABS
1000.0000 mg | ORAL_TABLET | Freq: Once | ORAL | Status: AC
  Administered 2024-04-18: 1000 mg via ORAL
  Filled 2024-04-18: qty 2

## 2024-04-18 MED ORDER — LIDOCAINE 5 % EX PTCH
1.0000 | MEDICATED_PATCH | CUTANEOUS | Status: DC
  Administered 2024-04-18: 1 via TRANSDERMAL
  Filled 2024-04-18: qty 1

## 2024-04-18 MED ORDER — CYCLOBENZAPRINE HCL 10 MG PO TABS
5.0000 mg | ORAL_TABLET | Freq: Once | ORAL | Status: AC
  Administered 2024-04-18: 5 mg via ORAL
  Filled 2024-04-18 (×2): qty 1

## 2024-04-18 MED ORDER — LIDOCAINE 5 % EX PTCH: 1.0000 | MEDICATED_PATCH | CUTANEOUS | 0 refills | Status: AC

## 2024-04-18 MED ORDER — METHOCARBAMOL 500 MG PO TABS: 500.0000 mg | ORAL_TABLET | Freq: Two times a day (BID) | ORAL | 0 refills | Status: AC

## 2024-04-18 NOTE — ED Notes (Signed)
Pt returned to triage from xray.

## 2024-04-18 NOTE — ED Provider Notes (Signed)
 [place] EMERGENCY DEPARTMENT AT [facility] Provider Note   CSN: [id number] Arrival date & time: 04/18/24  1645     Patient presents with: Back Pain and Chest Pain   Taylor Pierce is a 28 y.o. female.  Who presents emergency department chief complaint of left chest wall and flank pain.  Patient reports that she had onset of this pain starting this morning.  She describes it as aching.  She had some sharp pain as well.  She began feeling some chest tightness when she arrived around 4 PM.  Pain is worse with lifting her arm twisting, changing position, taking a deep breath.  She does not take estrogens.  She does not have a history of cancer.  She has no previous history of PE.  She denies hemoptysis or cough.  She denies any history of pulmonary embolus.  She did not have a plane ride at the beginning of July from here to [place] .    Back Pain Associated symptoms: chest pain   Chest Pain Associated symptoms: back pain        Prior to Admission medications   Medication Sig Start Date End Date Taking? Authorizing Provider  lamoTRIgine  (LAMICTAL ) 25 MG tablet Take 50 mg by mouth in the morning.   Yes [provider]  SIMBRINZA 1-0.2 % SUSP Place 1 drop into both eyes 3 (three) times daily.   Yes [provider]  VRAYLAR  1.5 MG capsule Take 1.5 mg by mouth in the morning.   Yes [provider]  ciprofloxacin  (CILOXAN ) 0.3 % ophthalmic solution Place 1 drop into the right eye as directed. Administer 1 drop, every 2 hours, while awake, for 2 days. Then 1 drop, every 4 hours, while awake, for the next 5 days. Patient not taking: Reported on 04/18/2024 07/02/23   Rising, [place], PA-C    Allergies: Nitrofurantoin and Other    Review of Systems  Cardiovascular:  Positive for chest pain.  Musculoskeletal:  Positive for back pain.    Updated Vital Signs BP (!) 129/97   Pulse 74   Temp 98.1 F (36.7 C)   Resp 12   Ht 5' 3 (1.6 m)   Wt 59 kg    SpO2 100%   BMI 23.03 kg/m   Physical Exam Vitals and nursing note reviewed.  Constitutional:      General: She is not in acute distress.    Appearance: She is well-developed. She is not diaphoretic.  HENT:     Head: Normocephalic and atraumatic.     Right Ear: External ear normal.     Left Ear: External ear normal.     Nose: Nose normal.     Mouth/Throat:     Mouth: Mucous membranes are moist.  Eyes:     General: No scleral icterus.    Conjunctiva/sclera: Conjunctivae normal.  Cardiovascular:     Rate and Rhythm: Normal rate and regular rhythm.     Heart sounds: Normal heart sounds. No murmur heard.    No friction rub. No gallop.  Pulmonary:     Effort: Pulmonary effort is normal. No respiratory distress.     Breath sounds: Normal breath sounds.  Chest:    Abdominal:     General: Bowel sounds are normal. There is no distension.     Palpations: Abdomen is soft. There is no mass.     Tenderness: There is no abdominal tenderness. There is no guarding.  Musculoskeletal:     Cervical back: Normal range  of motion.       Back:     Comments: Patient with lateral chest wall and posterior left thoracic pain.  She has palpable tenderness and spasm along the latissimus.  It hurts for her to raise her left arm above about shoulder height.  No rashes suggestive of zoster.  Skin:    General: Skin is warm and dry.  Neurological:     Mental Status: She is alert and oriented to person, place, and time.  Psychiatric:        Behavior: Behavior normal.     (all labs ordered are listed, but only abnormal results are displayed) Labs Reviewed  BASIC METABOLIC PANEL WITH GFR - Abnormal; Notable for the following components:      Result Value   Sodium 134 (*)    All other components within normal limits  CBC  HCG, SERUM, QUALITATIVE  D-DIMER, QUANTITATIVE  TROPONIN I (HIGH SENSITIVITY)  TROPONIN I (HIGH SENSITIVITY)    EKG: None  Radiology: DG Chest 2 View Result Date:  04/18/2024 CLINICAL DATA:  Left-sided chest pain since 10 a.m. EXAM: CHEST - 2 VIEW COMPARISON:  12/29/2021 FINDINGS: Frontal and lateral views of the chest demonstrate a stable cardiac silhouette. No airspace disease, effusion, or pneumothorax. Stable right convex scoliosis centered at the thoracolumbar junction. No acute bony abnormalities. IMPRESSION: 1. Stable chest, no acute process. Electronically Signed   By: Ozell Daring M.D.   On: 04/18/2024 17:28     Procedures   Medications Ordered in the ED  cyclobenzaprine  (FLEXERIL ) tablet 5 mg (has no administration in time range)  acetaminophen  (TYLENOL ) tablet 1,000 mg (has no administration in time range)  lidocaine  (LIDODERM ) 5 % 1 patch (has no administration in time range)                                    Medical Decision Making Amount and/or Complexity of Data Reviewed Labs: ordered. Radiology: ordered.  Risk OTC drugs. Prescription drug management.   Patient here with left-sided chest pain.  Worse with breathing, suspect musculoskeletal cause. Differential diagnosis includes pulmonary embolus, pneumothorax, pathologic fracture, neuropathy, zoster outbreak, spasm. I reviewed patient's labs.  No acute findings.  I have added on a D-dimer which needs to be collected.  Troponin negative x 2.  Visualized interpreted two-view chest x-ray which shows no acute findings. Visualized interpreted EKG which shows a normal sinus rhythm at a rate of 78  I suspect that patient has chest wall spasm.  I ordered Tylenol  Flexeril  and Lidoderm  patch.  Her D-dimer is pending.  Signout given to Dr. Franklyn at shift change      Final diagnoses:  None    ED Discharge Orders     None          Arloa Chroman, PA-C 04/18/24 2227    Franklyn Sid SAILOR, MD 04/19/24 0002

## 2024-04-18 NOTE — ED Triage Notes (Signed)
 Pt present POV. 11am this morning started to have a stabbing pain mid left back pain. 10 mins prior to arriving started to have chest tightness.

## 2024-04-18 NOTE — Discharge Instructions (Addendum)
 Thank you for coming to [facility] Emergency Department. You were seen for back pain. We did an exam, labs, and imaging, and these showed no acute findings. Likely you have a muscle strain. You can alternate taking Tylenol  and ibuprofen  as needed for pain. You can take 650mg  tylenol  (acetaminophen ) every 4-6 hours, and 600 mg ibuprofen  3 times a day. Heat, massage, and stretching can help as well. We have prescribed robaxin  500 mg twice per day as needed for muscle relaxer and lidocaine  patch as well. Please follow up with a primary doctor in 1 week if your symptoms have not improved.  Do not hesitate to return to the ED or call 911 if you experience: -Worsening symptoms -Lightheadedness, passing out -Fevers/chills -Anything else that concerns you
